# Patient Record
Sex: Female | Born: 1940
Health system: Southern US, Community
[De-identification: ages and names within clinical notes are randomized; demographics above are authoritative.]

## PROBLEM LIST (undated history)

## (undated) DIAGNOSIS — I639 Cerebral infarction, unspecified: Secondary | ICD-10-CM

## (undated) DIAGNOSIS — Z7901 Long term (current) use of anticoagulants: Secondary | ICD-10-CM

## (undated) DIAGNOSIS — Z9889 Other specified postprocedural states: Secondary | ICD-10-CM

## (undated) DIAGNOSIS — I25119 Atherosclerotic heart disease of native coronary artery with unspecified angina pectoris: Secondary | ICD-10-CM

## (undated) DIAGNOSIS — W19XXXS Unspecified fall, sequela: Secondary | ICD-10-CM

## (undated) DIAGNOSIS — I2510 Atherosclerotic heart disease of native coronary artery without angina pectoris: Secondary | ICD-10-CM

## (undated) DIAGNOSIS — I10 Essential (primary) hypertension: Secondary | ICD-10-CM

## (undated) DIAGNOSIS — G20 Parkinson's disease: Secondary | ICD-10-CM

## (undated) DIAGNOSIS — R279 Unspecified lack of coordination: Secondary | ICD-10-CM

## (undated) DIAGNOSIS — R251 Tremor, unspecified: Secondary | ICD-10-CM

## (undated) HISTORY — PX: HERNIA REPAIR: SHX51

## (undated) HISTORY — PX: ABDOMINAL HYSTERECTOMY: SHX81

## (undated) HISTORY — PX: CARDIAC SURGERY: SHX584

## (undated) HISTORY — DX: Unspecified lack of coordination: R27.9

## (undated) HISTORY — DX: Unspecified fall, sequela: W19.XXXS

## (undated) HISTORY — DX: Long term (current) use of anticoagulants: Z79.01

## (undated) HISTORY — PX: CHOLECYSTECTOMY: SHX55

## (undated) HISTORY — DX: Cerebral infarction, unspecified: I63.9

## (undated) HISTORY — PX: ABDOMINAL AORTIC ANEURYSM REPAIR: SHX42

## (undated) HISTORY — PX: CAROTID ENDARTERECTOMY: SUR193

## (undated) HISTORY — DX: Parkinson's disease: G20

## (undated) HISTORY — DX: Other specified postprocedural states: Z98.890

## (undated) HISTORY — DX: Atherosclerotic heart disease of native coronary artery with unspecified angina pectoris: I25.119

## (undated) HISTORY — DX: Tremor, unspecified: R25.1

---

## 2002-02-14 ENCOUNTER — Encounter: Payer: Self-pay | Admitting: Vascular Surgery

## 2002-02-18 ENCOUNTER — Ambulatory Visit (HOSPITAL_COMMUNITY): Admission: RE | Admit: 2002-02-18 | Discharge: 2002-02-18 | Payer: Self-pay | Admitting: Vascular Surgery

## 2002-02-21 ENCOUNTER — Encounter: Payer: Self-pay | Admitting: Vascular Surgery

## 2002-02-21 ENCOUNTER — Inpatient Hospital Stay (HOSPITAL_COMMUNITY): Admission: RE | Admit: 2002-02-21 | Discharge: 2002-02-26 | Payer: Self-pay | Admitting: Vascular Surgery

## 2002-02-22 ENCOUNTER — Encounter: Payer: Self-pay | Admitting: Vascular Surgery

## 2009-11-30 ENCOUNTER — Other Ambulatory Visit: Admission: RE | Admit: 2009-11-30 | Discharge: 2009-11-30 | Payer: Self-pay | Admitting: Diagnostic Radiology

## 2009-11-30 ENCOUNTER — Encounter: Admission: RE | Admit: 2009-11-30 | Discharge: 2009-11-30 | Payer: Self-pay | Admitting: Endocrinology

## 2010-07-22 NOTE — Op Note (Signed)
NAME:  Diana Simmons, Diana Simmons NO.:  [id number]   MEDICAL RECORD NO.:  [id number]                   PATIENT TYPE:  INP   LOCATION:  2314                                 FACILITY:  [facility]   PHYSICIAN:  Quita Skye. Hart Rochester, M.D.               DATE OF BIRTH:  Mar 28, 1940   DATE OF PROCEDURE:  02/21/2002  DATE OF DISCHARGE:                                 OPERATIVE REPORT   PREOPERATIVE DIAGNOSIS:  Infrarenal abdominal aortic aneurysm.   POSTOPERATIVE DIAGNOSIS:  Infrarenal abdominal aortic aneurysm.   OPERATION:  Resection and grafting of infrarenal abdominal aortic aneurysm  with insertion of an aorto-bi-common-iliac graft using a 14 x 7 mm  Hemashield Dacron graft.   SURGEON:  Quita Skye. Hart Rochester, M.D.   FIRST ASSISTANT:  Di Kindle. Edilia Bo, M.D.   SECOND ASSISTANT:  Lissa Merlin, P.A.   ANESTHESIA:  General endotracheal.   PROCEDURE:  The patient was taken to the operating room placed in the supine  position at which time satisfactory general endotracheal anesthesia was  administered.  A Swan-Ganz catheter and radial arterial line were inserted  by anesthesia.  The abdomen and groins were prepped with Betadine scrubbing  solution and draped in routine sterile manner.  Midline incision was made in  the abdomen from xiphoid to just above the pubis carried down through skin  and subcutaneous tissue with the scalpel.  The fascia was divided in the  midline and the peritoneal cavity entered.  Stomach, duodenum, small bowel  and colon were unremarkable.  The liver was smooth with no palpable masses.  Gallbladder appeared normal; no stones were palpable.  Transverse colon was  elevated, the intestines reflected to the right side, retroperitoneum  incised exposing the aorta from the renal arteries to the bifurcation.  There was a small saccular infrarenal aortic aneurysm measuring  approximately 4.5 to 5 cm in diameter.  There was about a 2-3 cm neck  proximally  which was encircled with an umbilical plate.  Both common iliac  arteries had posterior plaque but were widely patent and there was some  aneurysmal dilatation of the right common iliac artery about 3 cm proximal  to its bifurcation.  Twenty-five grams of Mannitol were given intravenously  and the patient was heparinized.  Aorta was occluded distal to the renal  arteries and the common iliacs occluded at their distal bifurcation,  aneurysm was opened anteriorly, there was no laminated thrombus.  Some  lumbars were oversewn with 2-0 silk figure-of-eight sutures from within.  Neck of the aneurysm was transected 2 cm distal to the renal arteries,  endarterectomy was not necessary.  A 14 x 7 mm Hemashield Dacron graft was  anastomosed end to end to the aortic stump using continuous 3-0 Prolene  buttressing this with a strip of felt.  This was checked for leaks; none  were present.  Following this end-to-end anastomoses  were done to the distal  common iliac arteries bilaterally to eliminate the area of aneurysmal  dilatation on the right.  The left leg was opened initially followed by the  right leg with no significant hypotension.  Protamine was then given to  reverse the heparin, following adequate hemostasis the wound was irrigated  with saline and closed in layers with Vicryl in the retroperitoneum as well  as the aneurysm sac.  Following thorough irrigation  the linea alba was closed with #1 Prolene, the skin with clips, sterile  dressing applied, the patient taken to the recovery room in satisfactory  condition.  She received one unit of blood from the Cellsaver, no bank  blood, was stable hemodynamically and had an excellent urinary output.                                               Quita Skye Hart Rochester, M.D.    JDL/MEDQ  D:  02/21/2002  T:  02/22/2002  Job:  [id number]   cc:   Norman Herrlich, M.D.  [facility]  [address]  [place], [place] [address]

## 2010-07-22 NOTE — Op Note (Signed)
NAME:  Diana Simmons, Diana Simmons NO.:  [id number]   MEDICAL RECORD NO.:  [id number]                   PATIENT TYPE:  OIB   LOCATION:  NA                                   FACILITY:  [facility]   PHYSICIAN:  Larina Earthly, M.D.                 DATE OF BIRTH:  01-06-1941   DATE OF PROCEDURE:  02/18/2002  DATE OF DISCHARGE:                                 OPERATIVE REPORT   PREOPERATIVE DIAGNOSIS:  Abdominal aortic aneurysm.   POSTOPERATIVE DIAGNOSIS:  Abdominal aortic aneurysm.   OPERATION PERFORMED:  Aortogram with bilateral lower extremity runoff.   SURGEON:  Larina Earthly, M.D.   ANESTHESIA:  1% lidocaine local.   COMPLICATIONS:  None.   DISPOSITION:  To holding area stable.   DESCRIPTION OF PROCEDURE:  The patient was taken to the peripheral vascular  cath lab and placed in supine position where the area of both groins was  prepped and draped in the usual sterile fashion.  Using local anesthesia and  single wall puncture, the right common femoral artery was entered and the  guidewire was passed up to level of the suprarenal aorta.  The 5 French  sheath was passed over the guidewire and a pigtail catheter was positioned  at the level of the suprarenal aorta.  She had a little blood there.  Runoff  is normal.  AP and lateral projections were undertaken at the level of the  renal arteries.  This revealed widely patent single renal arteries  bilaterally with a long normal infrarenal aortic net prior to the aneurysm  formation.  The iliac arteries were normal in caliber.  There was a small  aneurysm on the right common iliac artery with some ectasia at this level.  The external iliac arteries and left iliac arteries were totally normal.  There was no evidence of aneurysm in the hypogastric arteries.  The pigtail  catheter was then withdrawn down to the level of the aortic bifurcation.  Runoff was obtained.  This revealed widely patent superficial femoral  arteries bilaterally.  Also widely patent profunda femoris arteries  bilaterally.  The patient had widely patent popliteal arteries with small  irregularity in the left popliteal artery.  Runoff was normal bilaterally  with normal three-vessel runoff in both lower extremities.  The patient  tolerated the procedure without immediate complication and was transferred  to the holding area in stable condition.    FINDINGS:  1. Infrarenal abdominal aortic aneurysm extending to the level of the aortic     bifurcation.  2. Mild ectasia of the right common and left popliteal arteries with normal     three-vessel runoff bilaterally.  Larina Earthly, M.D.    TFE/MEDQ  D:  02/18/2002  T:  02/18/2002  Job:  [id number]   cc:   Baldo Daub, M.D.  [facility]  [address]  [place], [place]

## 2010-07-22 NOTE — H&P (Signed)
NAME:  Diana Simmons, Diana Simmons NO.:  [id number]   MEDICAL RECORD NO.:  [id number]                   PATIENT TYPE:  OIB   LOCATION:  NA                                   FACILITY:  [facility]   PHYSICIAN:  Quita Skye. Hart Rochester, M.D.               DATE OF BIRTH:  [birth year]   DATE OF ADMISSION:  DATE OF DISCHARGE:                                HISTORY & PHYSICAL   CHIEF COMPLAINT:  Abdominal aortic aneurysm.   HISTORY OF PRESENT ILLNESS:  This is a 70 year old white female who was  referred by Dr. Harrison Mons for evaluation of AAA.  She has had a known AAA for  some time.  It has recently increased 0.5 cm in size to 4.1 cm.  Dr. Hart Rochester  has been following this.  She has been very anxious and worried about the  chance of this aneurysm rupturing, and it has been affecting her lifestyle.  She has also been on Coumadin for history of cardiovascular disease and  previous TIAs.  She recently saw Dr. Hart Rochester, and he recommended going ahead  with resection and grafting of her AAA.  This has been scheduled for  02/21/2002.  On 02/19/2002, she is to have an arteriogram by Dr. Arbie Cookey.  Today she has no new complaints.  She has occasional abdominal pain,  especially after eating, and occasional chest discomfort but no new symptoms  with regard to her aneurysm.  She continues to work and enjoy fairly good  health.   PAST MEDICAL HISTORY:  1. TIA in 2001 and 2002 with resulting left body weakness with full recovery     attributed to vasospasm.  2. Hypercholesterolemia.  3. Coronary artery disease with MI in 1998.  4. GERD.  5. Hypertension.  6. Anxiety.  7. Uterine cancer.   PAST SURGICAL HISTORY:  1. Right carotid endarterectomy by Dr. Marlyn Corporal in 1996.  2. 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  3. Cystocele surgery.   MEDICATIONS:  1. Coumadin 7.5 mg daily, but this was discontinued on December 10.  She     takes it for cerebrovascular disease.  2. Verapamil SR  120 mg daily.  3. Toprol XL 50 mg daily.  4. Altace 1.25 mg daily.  5. Imdur 120 mg daily.  6. Zocor 40 mg daily.  7. Nexium 40 mg daily.  8. K-Dur 10 mEq daily.  9. Aspirin 81 mg daily.  10.      Folic acid daily.  11.      Effexor 75 mg daily.   ALLERGIES:  PLAVIX, SULFA, PERSANTINE, IV DYE, and NORVASC.   REVIEW OF SYSTEMS:  Positive for occasional dizziness, claudication, GERD,  frequency of urination, dyspnea on exertion, and general weakness.   FAMILY HISTORY:  Mother deceased at age 23 with coronary artery disease.  Father deceased at age 92 from coronary artery disease, CHF, and renal  disease.  She has one sister who has had a CABG and has a pacemaker.  She  has two brothers with heart trouble including arrhythmias.   SOCIAL HISTORY:  She is married.  Her husband has had a CABG by Dr. Riley Kill.  He is in fairly good health and continues to work.  They have two children,  one son, one daughter, both healthy.  She works as an Print production planner at [employer].  She smoked from 1970 to 1990 one pack per day and has  not smoked since.  She denies alcohol use.   PHYSICAL EXAMINATION:  VITAL SIGNS:  Blood pressure 158/110, pulse 72 and  regular, respirations 18.  GENERAL:  Well nourished, well developed, pleasant white female who appears  her stated age, moderately obese, no acute distress, alert and oriented x 3.  HEENT:  Normocephalic and atraumatic.  Pupils are equal, round, and reactive  to light.  Extraocular movements intact.  Visual acuity grossly intact OU.  NECK:  Supple.  No JVD, no bruits.  She has a right carotid endarterectomy  scar.  CHEST: Symmetrical.  Breath sounds are clear and equal anterior and  posterior.  CARDIAC:  Regular rate and rhythm.  S1, S2, with no murmur, rub, or gallop.  ABDOMEN: Soft, obese, nontender with bowel sounds present. There is a small  pulsatile mass palpated in the mid epigastric area.  There are no bruits.  EXTREMITIES:   Warm and well perfused.  Femoral pulses are 2+.  Popliteal and  dorsalis pedis pulses 2+.  NEUROLOGIC:  Grossly nonfocal. Gait is steady.  Muscle strength 5/5.  Deep  tendon reflexes are 3+.  Cranial nerves II-XII grossly intact.   ASSESSMENT/PLAN:  This is 70 year old white female with abdominal aortic  aneurysm who has mild discomfort and has been extremely worried about her  chance of rupture.  Dr. Hart Rochester has recommended going ahead with resection  and grafting of this aneurysm.  She is to have an arteriogram on 02/19/2002  by Dr. Arbie Cookey and resection and grafting of abdominal aortic aneurysm on  02/21/2002.  She has had recent clearance from her cardiologist with a  normal adenosine Cardiolite showing no ischemia and ejection fraction of  67%.  She also has had preoperative Dopplers and lower extremity ankle  brachial indices which were satisfactory.  Other routine tests will be  obtained.  She is stable for surgery.     Lissa Merlin, P.A.                          Quita Skye Hart Rochester, M.D.    Alwyn Ren  D:  02/14/2002  T:  02/14/2002  Job:  [id number]   cc:   Nadine Counts, M.D.   Dr. [facility]

## 2010-07-22 NOTE — Discharge Summary (Signed)
NAME:  Diana Simmons, Diana Simmons NO.:  [id number]   MEDICAL RECORD NO.:  [id number]                   PATIENT TYPE:  INP   LOCATION:  2033                                 FACILITY:  [facility]   PHYSICIAN:  Quita Skye. Hart Rochester, M.D.               DATE OF BIRTH:  1940/10/22   DATE OF ADMISSION:  02/21/2002  DATE OF DISCHARGE:  02/26/2002                                 DISCHARGE SUMMARY   ADMISSION DIAGNOSIS:  Abdominal aortic aneurysm.   PAST MEDICAL HISTORY:  1. TIA (attributed to vasospasm) 2001, 2002.  2. EC CVOD, status post carotid endarterectomy 1996 by Dr. Marlyn Corporal.  3. Coronary artery disease, status post MI 1998.  4. Hypertension.  5. Hyperlipidemia.  6. GERD.  7. Anxiety.  8. Uterine cancer, status post hysterectomy 1975.  9. Cystocele repair.   ALLERGIES:  1. PLAVIX.  2. SULFA.  3. PERSANTINE.  4. IV DYE.  5. NORVASC.   DISCHARGE DIAGNOSES:  A 4.1 cm infrarenal abdominal aortic aneurysm, status  post resection and grafting.   BRIEF HISTORY:  The patient is a 70 year old Caucasian female who Dr. Hart Rochester  had been following her AAA since 2/03, at that time it was 3.6 cm.  Ultrasound at [facility] in 7/03 revealed increased size of this  aneurysm to 4.1 cm.  She was evaluated by Dr. Hart Rochester in 8/03, and returned  again in 11/03 with a repeat duplex scan.  Throughout this entire time she  has remained asymptomatic for back or abdominal pain, but expressed anxiety  regarding the possibility of rupture.  This anxiety is affecting her daily  life and therefore Dr. Hart Rochester recommended proceeding with the surgery.  In  preparation for the surgery she was scheduled for an arteriogram on  02/19/02.   HOSPITAL COURSE:  On 02/21/02 the patient was electively admitted to [facility] under the care of  Dr. Quita Skye. Hart Rochester.  She underwent the following surgical procedure.  Resection and grafting of her abdominal aortic aneurysm with an aortoiliac  bypass with a 14 x 7 mm Hemashield graft.  She tolerated this procedure well  and was transferred in stable condition to the PACU.  She remained  hemodynamically stable during the perioperative period.  Her postoperative  course was essentially uneventful.  Her diet was advanced to liquids on  02/24/02 and solids on 02/25/02.  Her p.o. medications were also resumed on  02/25/02, and this included her Coumadin.   Postoperative ABIs were unchanged from preoperatively,(greater than one  bilaterally).   Recent laboratory studies 1/22 CBC revealed white blood cells 8.8,  hemoglobin 9.9, hematocrit 28.7, platelets were 92.  Chemistries included  sodium 133, potassium 3.8, BUN 8, creatinine 0.7, glucose 111.   DISCHARGE CONDITION:  Improved.   DISCHARGE INSTRUCTIONS:  1. Medications:  Tylox 1-2 p.o. q.4-6h. p.r.n. for pain.  She was instructed  to resume the following home medications:  Verapamil SR 120 mg p.o. q.d.,     Nexium 40 mg p.o. q.d., Zocor 40 mg p.o. q.d., K-Dur 10 mEq p.o. q.d.,     aspirin 81 mg p.o. q.d., folic acid 1 mg p.o. q.d., Effexor 75 mg p.o.     q.d., Toprol XL 50 mg p.o. q.d., Coumadin on her dose prior to admission.  2. Activities:  The patient was asked to refrain from any driving, or any     heavy lifting.  She should also continue her breathing exercises and     daily walking.  3. Diet:  Her diet should be a low fat, low salt diet.  4. Wound care:  She can shower with mild soap and water.  If her incisions     are red, hot, swollen or draining or she has a fever greater than 101     degrees fahrenheit, she should call Dr. Candie Chroman office.  5. Followup:  She has been instructed to follow up with her primary care     Kaeo Jacome regarding monitoring of her Coumadin.  She has an appointment at     the [facility] office on Wednesday, 03/05/02 for skin staple removal.  She will     see a registered nurse at that time.  She also has an appointment at the     [facility] office to  see Dr. Hart Rochester on Tuesday, 03/18/02 at 3:30 p.m.     Toribio Harbour, R.N.                  Quita Skye. Hart Rochester, M.D.    CTK/MEDQ  D:  03/31/2002  T:  03/31/2002  Job:  [id number]   cc:   Nadine Counts  [address]  [place]  [place] [address]  Fax: [phone]

## 2012-09-24 DIAGNOSIS — G20 Parkinson's disease: Secondary | ICD-10-CM

## 2012-09-24 DIAGNOSIS — G20A1 Parkinson's disease without dyskinesia, without mention of fluctuations: Secondary | ICD-10-CM

## 2012-09-24 DIAGNOSIS — R279 Unspecified lack of coordination: Secondary | ICD-10-CM

## 2012-09-24 HISTORY — DX: Unspecified lack of coordination: R27.9

## 2012-09-24 HISTORY — DX: Parkinson's disease without dyskinesia, without mention of fluctuations: G20.A1

## 2012-09-24 HISTORY — DX: Parkinson's disease: G20

## 2014-03-09 DIAGNOSIS — Z23 Encounter for immunization: Secondary | ICD-10-CM | POA: Diagnosis not present

## 2014-04-03 DIAGNOSIS — I639 Cerebral infarction, unspecified: Secondary | ICD-10-CM | POA: Diagnosis not present

## 2014-04-03 DIAGNOSIS — Z7901 Long term (current) use of anticoagulants: Secondary | ICD-10-CM | POA: Diagnosis not present

## 2014-05-04 DIAGNOSIS — Z7901 Long term (current) use of anticoagulants: Secondary | ICD-10-CM | POA: Diagnosis not present

## 2014-05-04 DIAGNOSIS — I639 Cerebral infarction, unspecified: Secondary | ICD-10-CM | POA: Diagnosis not present

## 2014-05-11 DIAGNOSIS — H2513 Age-related nuclear cataract, bilateral: Secondary | ICD-10-CM | POA: Diagnosis not present

## 2014-05-27 DIAGNOSIS — H3530 Unspecified macular degeneration: Secondary | ICD-10-CM | POA: Diagnosis not present

## 2014-05-27 DIAGNOSIS — H2511 Age-related nuclear cataract, right eye: Secondary | ICD-10-CM | POA: Diagnosis not present

## 2014-05-27 DIAGNOSIS — H25811 Combined forms of age-related cataract, right eye: Secondary | ICD-10-CM | POA: Diagnosis not present

## 2014-06-02 DIAGNOSIS — I639 Cerebral infarction, unspecified: Secondary | ICD-10-CM | POA: Diagnosis not present

## 2014-06-02 DIAGNOSIS — Z7901 Long term (current) use of anticoagulants: Secondary | ICD-10-CM | POA: Diagnosis not present

## 2014-06-09 DIAGNOSIS — I639 Cerebral infarction, unspecified: Secondary | ICD-10-CM | POA: Diagnosis not present

## 2014-06-09 DIAGNOSIS — Z7901 Long term (current) use of anticoagulants: Secondary | ICD-10-CM | POA: Diagnosis not present

## 2014-06-11 DIAGNOSIS — H25812 Combined forms of age-related cataract, left eye: Secondary | ICD-10-CM | POA: Diagnosis not present

## 2014-06-11 DIAGNOSIS — H2512 Age-related nuclear cataract, left eye: Secondary | ICD-10-CM | POA: Diagnosis not present

## 2014-06-15 DIAGNOSIS — Z7901 Long term (current) use of anticoagulants: Secondary | ICD-10-CM | POA: Diagnosis not present

## 2014-06-15 DIAGNOSIS — I639 Cerebral infarction, unspecified: Secondary | ICD-10-CM | POA: Diagnosis not present

## 2014-06-29 DIAGNOSIS — Z961 Presence of intraocular lens: Secondary | ICD-10-CM | POA: Diagnosis not present

## 2014-06-29 DIAGNOSIS — I639 Cerebral infarction, unspecified: Secondary | ICD-10-CM | POA: Diagnosis not present

## 2014-06-29 DIAGNOSIS — Z7901 Long term (current) use of anticoagulants: Secondary | ICD-10-CM | POA: Diagnosis not present

## 2014-07-15 DIAGNOSIS — I10 Essential (primary) hypertension: Secondary | ICD-10-CM | POA: Diagnosis not present

## 2014-07-15 DIAGNOSIS — Z1389 Encounter for screening for other disorder: Secondary | ICD-10-CM | POA: Diagnosis not present

## 2014-07-15 DIAGNOSIS — I639 Cerebral infarction, unspecified: Secondary | ICD-10-CM | POA: Diagnosis not present

## 2014-07-15 DIAGNOSIS — K219 Gastro-esophageal reflux disease without esophagitis: Secondary | ICD-10-CM | POA: Diagnosis not present

## 2014-07-15 DIAGNOSIS — E042 Nontoxic multinodular goiter: Secondary | ICD-10-CM | POA: Diagnosis not present

## 2014-07-15 DIAGNOSIS — E559 Vitamin D deficiency, unspecified: Secondary | ICD-10-CM | POA: Diagnosis not present

## 2014-07-15 DIAGNOSIS — M8580 Other specified disorders of bone density and structure, unspecified site: Secondary | ICD-10-CM | POA: Diagnosis not present

## 2014-07-15 DIAGNOSIS — F411 Generalized anxiety disorder: Secondary | ICD-10-CM | POA: Diagnosis not present

## 2014-07-15 DIAGNOSIS — G20 Parkinson's disease: Secondary | ICD-10-CM | POA: Diagnosis not present

## 2014-07-15 DIAGNOSIS — I2510 Atherosclerotic heart disease of native coronary artery without angina pectoris: Secondary | ICD-10-CM | POA: Diagnosis not present

## 2014-07-22 DIAGNOSIS — I679 Cerebrovascular disease, unspecified: Secondary | ICD-10-CM | POA: Diagnosis not present

## 2014-07-22 DIAGNOSIS — I6522 Occlusion and stenosis of left carotid artery: Secondary | ICD-10-CM | POA: Diagnosis not present

## 2014-07-22 DIAGNOSIS — I2510 Atherosclerotic heart disease of native coronary artery without angina pectoris: Secondary | ICD-10-CM | POA: Diagnosis not present

## 2014-07-22 DIAGNOSIS — I10 Essential (primary) hypertension: Secondary | ICD-10-CM | POA: Diagnosis not present

## 2014-07-24 DIAGNOSIS — Z1212 Encounter for screening for malignant neoplasm of rectum: Secondary | ICD-10-CM | POA: Diagnosis not present

## 2014-07-29 DIAGNOSIS — I639 Cerebral infarction, unspecified: Secondary | ICD-10-CM | POA: Diagnosis not present

## 2014-07-31 DIAGNOSIS — I639 Cerebral infarction, unspecified: Secondary | ICD-10-CM | POA: Diagnosis not present

## 2014-08-04 DIAGNOSIS — I639 Cerebral infarction, unspecified: Secondary | ICD-10-CM | POA: Diagnosis not present

## 2014-08-06 DIAGNOSIS — G20 Parkinson's disease: Secondary | ICD-10-CM | POA: Diagnosis not present

## 2014-08-17 DIAGNOSIS — I639 Cerebral infarction, unspecified: Secondary | ICD-10-CM | POA: Diagnosis not present

## 2014-08-31 DIAGNOSIS — I639 Cerebral infarction, unspecified: Secondary | ICD-10-CM | POA: Diagnosis not present

## 2014-09-10 DIAGNOSIS — I639 Cerebral infarction, unspecified: Secondary | ICD-10-CM | POA: Diagnosis not present

## 2014-09-17 DIAGNOSIS — I639 Cerebral infarction, unspecified: Secondary | ICD-10-CM | POA: Diagnosis not present

## 2014-09-23 DIAGNOSIS — M810 Age-related osteoporosis without current pathological fracture: Secondary | ICD-10-CM | POA: Diagnosis not present

## 2014-10-08 DIAGNOSIS — G20 Parkinson's disease: Secondary | ICD-10-CM | POA: Diagnosis not present

## 2014-10-19 DIAGNOSIS — Z7901 Long term (current) use of anticoagulants: Secondary | ICD-10-CM | POA: Diagnosis not present

## 2014-10-19 DIAGNOSIS — I10 Essential (primary) hypertension: Secondary | ICD-10-CM | POA: Diagnosis not present

## 2014-10-19 DIAGNOSIS — G20 Parkinson's disease: Secondary | ICD-10-CM | POA: Diagnosis not present

## 2014-10-19 DIAGNOSIS — I639 Cerebral infarction, unspecified: Secondary | ICD-10-CM | POA: Diagnosis not present

## 2014-11-17 DIAGNOSIS — I639 Cerebral infarction, unspecified: Secondary | ICD-10-CM | POA: Diagnosis not present

## 2014-11-17 DIAGNOSIS — M810 Age-related osteoporosis without current pathological fracture: Secondary | ICD-10-CM | POA: Diagnosis not present

## 2014-11-22 DIAGNOSIS — I25119 Atherosclerotic heart disease of native coronary artery with unspecified angina pectoris: Secondary | ICD-10-CM | POA: Insufficient documentation

## 2014-11-22 DIAGNOSIS — Z7901 Long term (current) use of anticoagulants: Secondary | ICD-10-CM

## 2014-11-22 HISTORY — DX: Atherosclerotic heart disease of native coronary artery with unspecified angina pectoris: I25.119

## 2014-11-22 HISTORY — DX: Long term (current) use of anticoagulants: Z79.01

## 2014-11-23 DIAGNOSIS — I25119 Atherosclerotic heart disease of native coronary artery with unspecified angina pectoris: Secondary | ICD-10-CM | POA: Diagnosis not present

## 2014-11-23 DIAGNOSIS — I2510 Atherosclerotic heart disease of native coronary artery without angina pectoris: Secondary | ICD-10-CM | POA: Diagnosis not present

## 2014-11-25 DIAGNOSIS — M8580 Other specified disorders of bone density and structure, unspecified site: Secondary | ICD-10-CM | POA: Diagnosis not present

## 2014-12-07 DIAGNOSIS — Z23 Encounter for immunization: Secondary | ICD-10-CM | POA: Diagnosis not present

## 2014-12-17 DIAGNOSIS — I639 Cerebral infarction, unspecified: Secondary | ICD-10-CM | POA: Diagnosis not present

## 2015-01-07 DIAGNOSIS — G20 Parkinson's disease: Secondary | ICD-10-CM | POA: Diagnosis not present

## 2015-01-18 DIAGNOSIS — I639 Cerebral infarction, unspecified: Secondary | ICD-10-CM | POA: Diagnosis not present

## 2015-02-18 DIAGNOSIS — I639 Cerebral infarction, unspecified: Secondary | ICD-10-CM | POA: Diagnosis not present

## 2015-03-22 DIAGNOSIS — I639 Cerebral infarction, unspecified: Secondary | ICD-10-CM | POA: Diagnosis not present

## 2015-03-28 DIAGNOSIS — I10 Essential (primary) hypertension: Secondary | ICD-10-CM | POA: Diagnosis not present

## 2015-03-28 DIAGNOSIS — R51 Headache: Secondary | ICD-10-CM | POA: Diagnosis not present

## 2015-03-28 DIAGNOSIS — E876 Hypokalemia: Secondary | ICD-10-CM | POA: Diagnosis not present

## 2015-03-29 DIAGNOSIS — I10 Essential (primary) hypertension: Secondary | ICD-10-CM | POA: Diagnosis not present

## 2015-03-31 DIAGNOSIS — I714 Abdominal aortic aneurysm, without rupture: Secondary | ICD-10-CM | POA: Diagnosis not present

## 2015-03-31 DIAGNOSIS — I10 Essential (primary) hypertension: Secondary | ICD-10-CM | POA: Diagnosis not present

## 2015-04-09 DIAGNOSIS — E876 Hypokalemia: Secondary | ICD-10-CM | POA: Diagnosis not present

## 2015-04-09 DIAGNOSIS — I639 Cerebral infarction, unspecified: Secondary | ICD-10-CM | POA: Diagnosis not present

## 2015-04-09 DIAGNOSIS — Z7901 Long term (current) use of anticoagulants: Secondary | ICD-10-CM | POA: Diagnosis not present

## 2015-04-09 DIAGNOSIS — I10 Essential (primary) hypertension: Secondary | ICD-10-CM | POA: Diagnosis not present

## 2015-04-16 DIAGNOSIS — Z1231 Encounter for screening mammogram for malignant neoplasm of breast: Secondary | ICD-10-CM | POA: Diagnosis not present

## 2015-04-22 DIAGNOSIS — I639 Cerebral infarction, unspecified: Secondary | ICD-10-CM | POA: Diagnosis not present

## 2015-04-29 DIAGNOSIS — I639 Cerebral infarction, unspecified: Secondary | ICD-10-CM | POA: Diagnosis not present

## 2015-05-06 DIAGNOSIS — I639 Cerebral infarction, unspecified: Secondary | ICD-10-CM | POA: Diagnosis not present

## 2015-05-19 DIAGNOSIS — I639 Cerebral infarction, unspecified: Secondary | ICD-10-CM | POA: Diagnosis not present

## 2015-05-31 DIAGNOSIS — Z7901 Long term (current) use of anticoagulants: Secondary | ICD-10-CM | POA: Diagnosis not present

## 2015-06-21 DIAGNOSIS — Z7901 Long term (current) use of anticoagulants: Secondary | ICD-10-CM | POA: Diagnosis not present

## 2015-07-19 DIAGNOSIS — R1011 Right upper quadrant pain: Secondary | ICD-10-CM | POA: Diagnosis not present

## 2015-07-19 DIAGNOSIS — Z7901 Long term (current) use of anticoagulants: Secondary | ICD-10-CM | POA: Diagnosis not present

## 2015-07-19 DIAGNOSIS — R1031 Right lower quadrant pain: Secondary | ICD-10-CM | POA: Diagnosis not present

## 2015-07-20 DIAGNOSIS — R1011 Right upper quadrant pain: Secondary | ICD-10-CM | POA: Diagnosis not present

## 2015-07-20 DIAGNOSIS — R197 Diarrhea, unspecified: Secondary | ICD-10-CM | POA: Diagnosis not present

## 2015-07-20 DIAGNOSIS — I10 Essential (primary) hypertension: Secondary | ICD-10-CM | POA: Diagnosis not present

## 2015-07-20 DIAGNOSIS — I25119 Atherosclerotic heart disease of native coronary artery with unspecified angina pectoris: Secondary | ICD-10-CM | POA: Diagnosis not present

## 2015-07-20 DIAGNOSIS — R1031 Right lower quadrant pain: Secondary | ICD-10-CM | POA: Diagnosis not present

## 2015-07-30 DIAGNOSIS — I10 Essential (primary) hypertension: Secondary | ICD-10-CM | POA: Diagnosis not present

## 2015-07-30 DIAGNOSIS — R350 Frequency of micturition: Secondary | ICD-10-CM | POA: Diagnosis not present

## 2015-08-16 DIAGNOSIS — Z7901 Long term (current) use of anticoagulants: Secondary | ICD-10-CM | POA: Diagnosis not present

## 2015-08-17 DIAGNOSIS — Z9889 Other specified postprocedural states: Secondary | ICD-10-CM | POA: Diagnosis not present

## 2015-08-17 DIAGNOSIS — G20 Parkinson's disease: Secondary | ICD-10-CM | POA: Diagnosis not present

## 2015-08-17 DIAGNOSIS — I639 Cerebral infarction, unspecified: Secondary | ICD-10-CM | POA: Diagnosis not present

## 2015-08-17 DIAGNOSIS — W19XXXS Unspecified fall, sequela: Secondary | ICD-10-CM | POA: Diagnosis not present

## 2015-08-17 DIAGNOSIS — R251 Tremor, unspecified: Secondary | ICD-10-CM | POA: Diagnosis not present

## 2015-08-31 DIAGNOSIS — K591 Functional diarrhea: Secondary | ICD-10-CM | POA: Diagnosis not present

## 2015-08-31 DIAGNOSIS — R1011 Right upper quadrant pain: Secondary | ICD-10-CM | POA: Diagnosis not present

## 2015-09-09 DIAGNOSIS — R1011 Right upper quadrant pain: Secondary | ICD-10-CM | POA: Diagnosis not present

## 2015-09-10 DIAGNOSIS — K5730 Diverticulosis of large intestine without perforation or abscess without bleeding: Secondary | ICD-10-CM | POA: Diagnosis not present

## 2015-09-10 DIAGNOSIS — K640 First degree hemorrhoids: Secondary | ICD-10-CM | POA: Diagnosis not present

## 2015-09-10 DIAGNOSIS — D175 Benign lipomatous neoplasm of intra-abdominal organs: Secondary | ICD-10-CM | POA: Diagnosis not present

## 2015-09-10 DIAGNOSIS — K591 Functional diarrhea: Secondary | ICD-10-CM | POA: Diagnosis not present

## 2015-09-10 DIAGNOSIS — K2900 Acute gastritis without bleeding: Secondary | ICD-10-CM | POA: Diagnosis not present

## 2015-09-10 DIAGNOSIS — R1011 Right upper quadrant pain: Secondary | ICD-10-CM | POA: Diagnosis not present

## 2015-09-10 DIAGNOSIS — K649 Unspecified hemorrhoids: Secondary | ICD-10-CM | POA: Diagnosis not present

## 2015-09-13 DIAGNOSIS — Z7901 Long term (current) use of anticoagulants: Secondary | ICD-10-CM | POA: Diagnosis not present

## 2015-09-17 DIAGNOSIS — I10 Essential (primary) hypertension: Secondary | ICD-10-CM | POA: Diagnosis not present

## 2015-09-17 DIAGNOSIS — E559 Vitamin D deficiency, unspecified: Secondary | ICD-10-CM | POA: Diagnosis not present

## 2015-09-17 DIAGNOSIS — Z0000 Encounter for general adult medical examination without abnormal findings: Secondary | ICD-10-CM | POA: Diagnosis not present

## 2015-09-17 DIAGNOSIS — Z1389 Encounter for screening for other disorder: Secondary | ICD-10-CM | POA: Diagnosis not present

## 2015-09-17 DIAGNOSIS — E042 Nontoxic multinodular goiter: Secondary | ICD-10-CM | POA: Diagnosis not present

## 2015-09-17 DIAGNOSIS — M810 Age-related osteoporosis without current pathological fracture: Secondary | ICD-10-CM | POA: Diagnosis not present

## 2015-09-17 DIAGNOSIS — I679 Cerebrovascular disease, unspecified: Secondary | ICD-10-CM | POA: Diagnosis not present

## 2015-09-17 DIAGNOSIS — I2510 Atherosclerotic heart disease of native coronary artery without angina pectoris: Secondary | ICD-10-CM | POA: Diagnosis not present

## 2015-09-17 DIAGNOSIS — E7800 Pure hypercholesterolemia, unspecified: Secondary | ICD-10-CM | POA: Diagnosis not present

## 2015-09-28 DIAGNOSIS — M6281 Muscle weakness (generalized): Secondary | ICD-10-CM | POA: Diagnosis not present

## 2015-09-28 DIAGNOSIS — R269 Unspecified abnormalities of gait and mobility: Secondary | ICD-10-CM | POA: Diagnosis not present

## 2015-09-28 DIAGNOSIS — R278 Other lack of coordination: Secondary | ICD-10-CM | POA: Diagnosis not present

## 2015-09-28 DIAGNOSIS — R201 Hypoesthesia of skin: Secondary | ICD-10-CM | POA: Diagnosis not present

## 2015-09-28 DIAGNOSIS — R2681 Unsteadiness on feet: Secondary | ICD-10-CM | POA: Diagnosis not present

## 2015-09-29 DIAGNOSIS — I6523 Occlusion and stenosis of bilateral carotid arteries: Secondary | ICD-10-CM | POA: Diagnosis not present

## 2015-09-29 DIAGNOSIS — Z780 Asymptomatic menopausal state: Secondary | ICD-10-CM | POA: Diagnosis not present

## 2015-09-29 DIAGNOSIS — M8588 Other specified disorders of bone density and structure, other site: Secondary | ICD-10-CM | POA: Diagnosis not present

## 2015-09-30 DIAGNOSIS — M6281 Muscle weakness (generalized): Secondary | ICD-10-CM | POA: Diagnosis not present

## 2015-09-30 DIAGNOSIS — R269 Unspecified abnormalities of gait and mobility: Secondary | ICD-10-CM | POA: Diagnosis not present

## 2015-09-30 DIAGNOSIS — R278 Other lack of coordination: Secondary | ICD-10-CM | POA: Diagnosis not present

## 2015-09-30 DIAGNOSIS — R201 Hypoesthesia of skin: Secondary | ICD-10-CM | POA: Diagnosis not present

## 2015-09-30 DIAGNOSIS — R2681 Unsteadiness on feet: Secondary | ICD-10-CM | POA: Diagnosis not present

## 2015-10-05 DIAGNOSIS — M6281 Muscle weakness (generalized): Secondary | ICD-10-CM | POA: Diagnosis not present

## 2015-10-05 DIAGNOSIS — R278 Other lack of coordination: Secondary | ICD-10-CM | POA: Diagnosis not present

## 2015-10-05 DIAGNOSIS — R2681 Unsteadiness on feet: Secondary | ICD-10-CM | POA: Diagnosis not present

## 2015-10-05 DIAGNOSIS — R201 Hypoesthesia of skin: Secondary | ICD-10-CM | POA: Diagnosis not present

## 2015-10-05 DIAGNOSIS — R269 Unspecified abnormalities of gait and mobility: Secondary | ICD-10-CM | POA: Diagnosis not present

## 2015-10-07 DIAGNOSIS — R2681 Unsteadiness on feet: Secondary | ICD-10-CM | POA: Diagnosis not present

## 2015-10-07 DIAGNOSIS — R278 Other lack of coordination: Secondary | ICD-10-CM | POA: Diagnosis not present

## 2015-10-07 DIAGNOSIS — M6281 Muscle weakness (generalized): Secondary | ICD-10-CM | POA: Diagnosis not present

## 2015-10-07 DIAGNOSIS — R269 Unspecified abnormalities of gait and mobility: Secondary | ICD-10-CM | POA: Diagnosis not present

## 2015-10-07 DIAGNOSIS — R201 Hypoesthesia of skin: Secondary | ICD-10-CM | POA: Diagnosis not present

## 2015-10-11 DIAGNOSIS — Z7901 Long term (current) use of anticoagulants: Secondary | ICD-10-CM | POA: Diagnosis not present

## 2015-10-12 DIAGNOSIS — M6281 Muscle weakness (generalized): Secondary | ICD-10-CM | POA: Diagnosis not present

## 2015-10-12 DIAGNOSIS — R2681 Unsteadiness on feet: Secondary | ICD-10-CM | POA: Diagnosis not present

## 2015-10-12 DIAGNOSIS — R278 Other lack of coordination: Secondary | ICD-10-CM | POA: Diagnosis not present

## 2015-10-12 DIAGNOSIS — R269 Unspecified abnormalities of gait and mobility: Secondary | ICD-10-CM | POA: Diagnosis not present

## 2015-10-12 DIAGNOSIS — R201 Hypoesthesia of skin: Secondary | ICD-10-CM | POA: Diagnosis not present

## 2015-10-19 DIAGNOSIS — R278 Other lack of coordination: Secondary | ICD-10-CM | POA: Diagnosis not present

## 2015-10-19 DIAGNOSIS — R269 Unspecified abnormalities of gait and mobility: Secondary | ICD-10-CM | POA: Diagnosis not present

## 2015-10-19 DIAGNOSIS — R201 Hypoesthesia of skin: Secondary | ICD-10-CM | POA: Diagnosis not present

## 2015-10-19 DIAGNOSIS — R2681 Unsteadiness on feet: Secondary | ICD-10-CM | POA: Diagnosis not present

## 2015-10-19 DIAGNOSIS — M6281 Muscle weakness (generalized): Secondary | ICD-10-CM | POA: Diagnosis not present

## 2015-10-21 DIAGNOSIS — R269 Unspecified abnormalities of gait and mobility: Secondary | ICD-10-CM | POA: Diagnosis not present

## 2015-10-21 DIAGNOSIS — R278 Other lack of coordination: Secondary | ICD-10-CM | POA: Diagnosis not present

## 2015-10-21 DIAGNOSIS — M6281 Muscle weakness (generalized): Secondary | ICD-10-CM | POA: Diagnosis not present

## 2015-10-21 DIAGNOSIS — R2681 Unsteadiness on feet: Secondary | ICD-10-CM | POA: Diagnosis not present

## 2015-10-21 DIAGNOSIS — R201 Hypoesthesia of skin: Secondary | ICD-10-CM | POA: Diagnosis not present

## 2015-10-25 DIAGNOSIS — R278 Other lack of coordination: Secondary | ICD-10-CM | POA: Diagnosis not present

## 2015-10-25 DIAGNOSIS — R269 Unspecified abnormalities of gait and mobility: Secondary | ICD-10-CM | POA: Diagnosis not present

## 2015-10-25 DIAGNOSIS — R201 Hypoesthesia of skin: Secondary | ICD-10-CM | POA: Diagnosis not present

## 2015-10-25 DIAGNOSIS — M6281 Muscle weakness (generalized): Secondary | ICD-10-CM | POA: Diagnosis not present

## 2015-10-25 DIAGNOSIS — R2681 Unsteadiness on feet: Secondary | ICD-10-CM | POA: Diagnosis not present

## 2015-10-27 DIAGNOSIS — R278 Other lack of coordination: Secondary | ICD-10-CM | POA: Diagnosis not present

## 2015-10-27 DIAGNOSIS — Z23 Encounter for immunization: Secondary | ICD-10-CM | POA: Diagnosis not present

## 2015-10-27 DIAGNOSIS — R2681 Unsteadiness on feet: Secondary | ICD-10-CM | POA: Diagnosis not present

## 2015-10-27 DIAGNOSIS — R269 Unspecified abnormalities of gait and mobility: Secondary | ICD-10-CM | POA: Diagnosis not present

## 2015-10-27 DIAGNOSIS — M6281 Muscle weakness (generalized): Secondary | ICD-10-CM | POA: Diagnosis not present

## 2015-10-27 DIAGNOSIS — R201 Hypoesthesia of skin: Secondary | ICD-10-CM | POA: Diagnosis not present

## 2015-10-29 DIAGNOSIS — R2681 Unsteadiness on feet: Secondary | ICD-10-CM | POA: Diagnosis not present

## 2015-10-29 DIAGNOSIS — R278 Other lack of coordination: Secondary | ICD-10-CM | POA: Diagnosis not present

## 2015-10-29 DIAGNOSIS — R269 Unspecified abnormalities of gait and mobility: Secondary | ICD-10-CM | POA: Diagnosis not present

## 2015-10-29 DIAGNOSIS — M6281 Muscle weakness (generalized): Secondary | ICD-10-CM | POA: Diagnosis not present

## 2015-10-29 DIAGNOSIS — R201 Hypoesthesia of skin: Secondary | ICD-10-CM | POA: Diagnosis not present

## 2015-11-01 DIAGNOSIS — R278 Other lack of coordination: Secondary | ICD-10-CM | POA: Diagnosis not present

## 2015-11-01 DIAGNOSIS — R2681 Unsteadiness on feet: Secondary | ICD-10-CM | POA: Diagnosis not present

## 2015-11-01 DIAGNOSIS — R201 Hypoesthesia of skin: Secondary | ICD-10-CM | POA: Diagnosis not present

## 2015-11-01 DIAGNOSIS — M6281 Muscle weakness (generalized): Secondary | ICD-10-CM | POA: Diagnosis not present

## 2015-11-01 DIAGNOSIS — R269 Unspecified abnormalities of gait and mobility: Secondary | ICD-10-CM | POA: Diagnosis not present

## 2015-11-03 DIAGNOSIS — R269 Unspecified abnormalities of gait and mobility: Secondary | ICD-10-CM | POA: Diagnosis not present

## 2015-11-03 DIAGNOSIS — R2681 Unsteadiness on feet: Secondary | ICD-10-CM | POA: Diagnosis not present

## 2015-11-03 DIAGNOSIS — R278 Other lack of coordination: Secondary | ICD-10-CM | POA: Diagnosis not present

## 2015-11-03 DIAGNOSIS — M6281 Muscle weakness (generalized): Secondary | ICD-10-CM | POA: Diagnosis not present

## 2015-11-03 DIAGNOSIS — R201 Hypoesthesia of skin: Secondary | ICD-10-CM | POA: Diagnosis not present

## 2015-11-08 DIAGNOSIS — Z7901 Long term (current) use of anticoagulants: Secondary | ICD-10-CM | POA: Diagnosis not present

## 2015-11-09 DIAGNOSIS — R2681 Unsteadiness on feet: Secondary | ICD-10-CM | POA: Diagnosis not present

## 2015-11-09 DIAGNOSIS — M6281 Muscle weakness (generalized): Secondary | ICD-10-CM | POA: Diagnosis not present

## 2015-11-09 DIAGNOSIS — R201 Hypoesthesia of skin: Secondary | ICD-10-CM | POA: Diagnosis not present

## 2015-11-09 DIAGNOSIS — R278 Other lack of coordination: Secondary | ICD-10-CM | POA: Diagnosis not present

## 2015-11-11 DIAGNOSIS — R2681 Unsteadiness on feet: Secondary | ICD-10-CM | POA: Diagnosis not present

## 2015-11-11 DIAGNOSIS — R201 Hypoesthesia of skin: Secondary | ICD-10-CM | POA: Diagnosis not present

## 2015-11-11 DIAGNOSIS — M6281 Muscle weakness (generalized): Secondary | ICD-10-CM | POA: Diagnosis not present

## 2015-11-11 DIAGNOSIS — R278 Other lack of coordination: Secondary | ICD-10-CM | POA: Diagnosis not present

## 2015-11-23 DIAGNOSIS — M6281 Muscle weakness (generalized): Secondary | ICD-10-CM | POA: Diagnosis not present

## 2015-11-23 DIAGNOSIS — R201 Hypoesthesia of skin: Secondary | ICD-10-CM | POA: Diagnosis not present

## 2015-11-23 DIAGNOSIS — R2681 Unsteadiness on feet: Secondary | ICD-10-CM | POA: Diagnosis not present

## 2015-11-23 DIAGNOSIS — R278 Other lack of coordination: Secondary | ICD-10-CM | POA: Diagnosis not present

## 2015-11-25 DIAGNOSIS — R201 Hypoesthesia of skin: Secondary | ICD-10-CM | POA: Diagnosis not present

## 2015-11-25 DIAGNOSIS — M6281 Muscle weakness (generalized): Secondary | ICD-10-CM | POA: Diagnosis not present

## 2015-11-25 DIAGNOSIS — R278 Other lack of coordination: Secondary | ICD-10-CM | POA: Diagnosis not present

## 2015-11-25 DIAGNOSIS — R2681 Unsteadiness on feet: Secondary | ICD-10-CM | POA: Diagnosis not present

## 2015-12-07 DIAGNOSIS — Z7901 Long term (current) use of anticoagulants: Secondary | ICD-10-CM | POA: Diagnosis not present

## 2015-12-21 DIAGNOSIS — Z5181 Encounter for therapeutic drug level monitoring: Secondary | ICD-10-CM | POA: Diagnosis not present

## 2015-12-21 DIAGNOSIS — I679 Cerebrovascular disease, unspecified: Secondary | ICD-10-CM | POA: Diagnosis not present

## 2015-12-21 DIAGNOSIS — M549 Dorsalgia, unspecified: Secondary | ICD-10-CM | POA: Diagnosis not present

## 2015-12-21 DIAGNOSIS — I10 Essential (primary) hypertension: Secondary | ICD-10-CM | POA: Diagnosis not present

## 2015-12-21 DIAGNOSIS — Z79899 Other long term (current) drug therapy: Secondary | ICD-10-CM | POA: Diagnosis not present

## 2016-01-03 DIAGNOSIS — Z7901 Long term (current) use of anticoagulants: Secondary | ICD-10-CM | POA: Diagnosis not present

## 2016-01-31 DIAGNOSIS — Z7901 Long term (current) use of anticoagulants: Secondary | ICD-10-CM | POA: Diagnosis not present

## 2016-02-07 DIAGNOSIS — I10 Essential (primary) hypertension: Secondary | ICD-10-CM | POA: Diagnosis not present

## 2016-02-07 DIAGNOSIS — M533 Sacrococcygeal disorders, not elsewhere classified: Secondary | ICD-10-CM | POA: Diagnosis not present

## 2016-02-15 DIAGNOSIS — R251 Tremor, unspecified: Secondary | ICD-10-CM

## 2016-02-15 DIAGNOSIS — Z9889 Other specified postprocedural states: Secondary | ICD-10-CM

## 2016-02-15 DIAGNOSIS — I639 Cerebral infarction, unspecified: Secondary | ICD-10-CM

## 2016-02-15 DIAGNOSIS — W19XXXS Unspecified fall, sequela: Secondary | ICD-10-CM

## 2016-02-15 HISTORY — DX: Tremor, unspecified: R25.1

## 2016-02-15 HISTORY — DX: Unspecified fall, sequela: W19.XXXS

## 2016-02-15 HISTORY — DX: Cerebral infarction, unspecified: I63.9

## 2016-02-15 HISTORY — DX: Other specified postprocedural states: Z98.890

## 2016-03-01 DIAGNOSIS — Z7901 Long term (current) use of anticoagulants: Secondary | ICD-10-CM | POA: Diagnosis not present

## 2016-03-28 DIAGNOSIS — Z7901 Long term (current) use of anticoagulants: Secondary | ICD-10-CM | POA: Diagnosis not present

## 2016-04-18 DIAGNOSIS — M533 Sacrococcygeal disorders, not elsewhere classified: Secondary | ICD-10-CM | POA: Diagnosis not present

## 2016-04-21 DIAGNOSIS — M5416 Radiculopathy, lumbar region: Secondary | ICD-10-CM | POA: Diagnosis not present

## 2016-04-24 DIAGNOSIS — Z7901 Long term (current) use of anticoagulants: Secondary | ICD-10-CM | POA: Diagnosis not present

## 2016-04-26 DIAGNOSIS — M5416 Radiculopathy, lumbar region: Secondary | ICD-10-CM | POA: Diagnosis not present

## 2016-04-26 DIAGNOSIS — M545 Low back pain: Secondary | ICD-10-CM | POA: Diagnosis not present

## 2016-05-04 DIAGNOSIS — M5416 Radiculopathy, lumbar region: Secondary | ICD-10-CM | POA: Diagnosis not present

## 2016-05-04 DIAGNOSIS — M48061 Spinal stenosis, lumbar region without neurogenic claudication: Secondary | ICD-10-CM | POA: Diagnosis not present

## 2016-05-04 DIAGNOSIS — Z7901 Long term (current) use of anticoagulants: Secondary | ICD-10-CM | POA: Diagnosis not present

## 2016-05-22 DIAGNOSIS — Z7901 Long term (current) use of anticoagulants: Secondary | ICD-10-CM | POA: Diagnosis not present

## 2016-06-19 DIAGNOSIS — Z7901 Long term (current) use of anticoagulants: Secondary | ICD-10-CM | POA: Diagnosis not present

## 2016-07-03 DIAGNOSIS — H16201 Unspecified keratoconjunctivitis, right eye: Secondary | ICD-10-CM | POA: Diagnosis not present

## 2016-07-05 DIAGNOSIS — H16201 Unspecified keratoconjunctivitis, right eye: Secondary | ICD-10-CM | POA: Diagnosis not present

## 2016-07-12 ENCOUNTER — Encounter (HOSPITAL_COMMUNITY): Payer: Self-pay | Admitting: Emergency Medicine

## 2016-07-12 ENCOUNTER — Emergency Department (HOSPITAL_COMMUNITY): Payer: Medicare Other

## 2016-07-12 ENCOUNTER — Emergency Department (HOSPITAL_COMMUNITY)
Admission: EM | Admit: 2016-07-12 | Discharge: 2016-07-13 | Disposition: A | Discharge status: Home / Self Care | Payer: Medicare Other | Attending: Emergency Medicine | Admitting: Emergency Medicine

## 2016-07-12 DIAGNOSIS — S0990XA Unspecified injury of head, initial encounter: Secondary | ICD-10-CM | POA: Insufficient documentation

## 2016-07-12 DIAGNOSIS — Y929 Unspecified place or not applicable: Secondary | ICD-10-CM | POA: Insufficient documentation

## 2016-07-12 DIAGNOSIS — S2242XA Multiple fractures of ribs, left side, initial encounter for closed fracture: Secondary | ICD-10-CM | POA: Insufficient documentation

## 2016-07-12 DIAGNOSIS — S299XXA Unspecified injury of thorax, initial encounter: Secondary | ICD-10-CM | POA: Diagnosis not present

## 2016-07-12 DIAGNOSIS — I10 Essential (primary) hypertension: Secondary | ICD-10-CM | POA: Diagnosis not present

## 2016-07-12 DIAGNOSIS — Y939 Activity, unspecified: Secondary | ICD-10-CM | POA: Diagnosis not present

## 2016-07-12 DIAGNOSIS — S0083XA Contusion of other part of head, initial encounter: Secondary | ICD-10-CM | POA: Diagnosis not present

## 2016-07-12 DIAGNOSIS — R0781 Pleurodynia: Secondary | ICD-10-CM | POA: Diagnosis not present

## 2016-07-12 DIAGNOSIS — Y999 Unspecified external cause status: Secondary | ICD-10-CM | POA: Diagnosis not present

## 2016-07-12 DIAGNOSIS — I2510 Atherosclerotic heart disease of native coronary artery without angina pectoris: Secondary | ICD-10-CM | POA: Diagnosis not present

## 2016-07-12 DIAGNOSIS — W01198A Fall on same level from slipping, tripping and stumbling with subsequent striking against other object, initial encounter: Secondary | ICD-10-CM | POA: Diagnosis not present

## 2016-07-12 DIAGNOSIS — S41109A Unspecified open wound of unspecified upper arm, initial encounter: Secondary | ICD-10-CM | POA: Diagnosis not present

## 2016-07-12 DIAGNOSIS — Z8673 Personal history of transient ischemic attack (TIA), and cerebral infarction without residual deficits: Secondary | ICD-10-CM | POA: Diagnosis not present

## 2016-07-12 DIAGNOSIS — Z23 Encounter for immunization: Secondary | ICD-10-CM | POA: Insufficient documentation

## 2016-07-12 DIAGNOSIS — T148XXA Other injury of unspecified body region, initial encounter: Secondary | ICD-10-CM | POA: Diagnosis not present

## 2016-07-12 HISTORY — DX: Cerebral infarction, unspecified: I63.9

## 2016-07-12 HISTORY — DX: Essential (primary) hypertension: I10

## 2016-07-12 HISTORY — DX: Atherosclerotic heart disease of native coronary artery without angina pectoris: I25.10

## 2016-07-12 MED ORDER — HYDROCODONE-ACETAMINOPHEN 5-325 MG PO TABS
1.0000 | ORAL_TABLET | Freq: Once | ORAL | Status: AC
  Administered 2016-07-12: 1 via ORAL
  Filled 2016-07-12: qty 1

## 2016-07-12 MED ORDER — TETANUS-DIPHTH-ACELL PERTUSSIS 5-2.5-18.5 LF-MCG/0.5 IM SUSP
0.5000 mL | Freq: Once | INTRAMUSCULAR | Status: AC
  Administered 2016-07-12: 0.5 mL via INTRAMUSCULAR
  Filled 2016-07-12: qty 0.5

## 2016-07-12 NOTE — ED Notes (Signed)
Patient transported to X-ray 

## 2016-07-12 NOTE — ED Notes (Signed)
Pt taken to CT.

## 2016-07-12 NOTE — ED Notes (Addendum)
Pt belongings: shirt, undergarment, and readers glasses in personal belonging bag @ bedside.

## 2016-07-12 NOTE — ED Triage Notes (Signed)
Per EMS, pt fell on the bleachers at a game, No LOC or memory loss, reports left flank pain and left femor.  Clear lung sounds, hurts w/ deep breaths. Pt is on blood thinners Hx of stoke w/ left sided weakness, MI and anxiety.

## 2016-07-12 NOTE — ED Provider Notes (Signed)
Medical screening examination/treatment/procedure(s) were conducted as a shared visit with non-physician practitioner(s) and myself.  I personally evaluated the patient during the encounter.  Fall with multiple rib fractures on coumadin and hit head w/o LOC.  On my exam, neuro intact. Has significant left chest tenderness but normal breath sounds, oxygen of 98% and no tachypnea. No obviou secchymosis currently. Low suspicion for hemo/pneumo-thorax but at risk for same so long discussion had regarding strict return precautison. otherwise stable for dc on pain control if head CT unremarkable.   Merrily Pew, MD 07/13/16 1230

## 2016-07-12 NOTE — ED Notes (Signed)
Pt returned from CT °

## 2016-07-12 NOTE — ED Provider Notes (Signed)
[facility] DEPT Provider Note   CSN: [id number] Arrival date & time: 07/12/16  2057     History   Chief Complaint Chief Complaint  Patient presents with  . Fall    HPI Diana Simmons is a 76 y.o. female.  This a 76 year old female who was at a soccer game when she stood up after the game was completed.  She became slightly dizzy and fell to the ground without losing consciousness.  She hit the left ribs, on the side of a bleacher.  She hit her left elbow on the concrete, causing a small abrasion and hit her head in the left temporal area on an unknown object.  She has a slight headache.  She states she's got some blurry vision and she feels sleepy.      Past Medical History:  Diagnosis Date  . Coronary artery disease   . Hypertension   . Stroke [facility])     There are no active problems to display for this patient.   Past Surgical History:  Procedure Laterality Date  . CARDIAC SURGERY      OB History    No data available       Home Medications    Prior to Admission medications   Medication Sig Start Date End Date Taking? Authorizing Provider  HYDROcodone-acetaminophen (NORCO/VICODIN) 5-325 MG tablet Take 1-2 tablets by mouth every 4 (four) hours as needed for moderate pain. 07/13/16   Junius Creamer, NP    Family History No family history on file.  Social History Social History  Substance Use Topics  . Smoking status: Not on file  . Smokeless tobacco: Not on file  . Alcohol use Not on file     Allergies   Patient has no allergy information on record.   Review of Systems Review of Systems  Constitutional: Negative for fever.  Eyes: Positive for visual disturbance.  Cardiovascular: Positive for chest pain.  Skin: Positive for wound.  Neurological: Positive for dizziness. Negative for headaches.  All other systems reviewed and are negative.    Physical Exam Updated Vital Signs BP (!) 164/84   Pulse 68   Temp 99.2 F (37.3 C) (Oral)   Resp  18   SpO2 100%   Physical Exam  Constitutional: She appears well-developed and well-nourished.  HENT:  Head: Normocephalic.  Eyes: Pupils are equal, round, and reactive to light.  Neck: Normal range of motion.  Cardiovascular: Normal rate.   Pulmonary/Chest: Effort normal. She exhibits tenderness. She exhibits no crepitus and no deformity.    Area is tender to palpation, no deformity.  Equal chest rise and fall.  Breath sounds clear  Abdominal: Soft.  Musculoskeletal: She exhibits tenderness. She exhibits no edema or deformity.       Arms: Neurological: She is alert.  Skin: Skin is warm and dry.  Psychiatric: She has a normal mood and affect.  Nursing note and vitals reviewed.    ED Treatments / Results  Labs (all labs ordered are listed, but only abnormal results are displayed) Labs Reviewed - No data to display  EKG  EKG Interpretation None       Radiology Dg Ribs Unilateral W/chest Left  Result Date: 07/12/2016 CLINICAL DATA:  Status post fall, with left anterior rib pain. Initial encounter. EXAM: LEFT RIBS AND CHEST - 3+ VIEW COMPARISON:  Chest radiograph performed 03/28/2015 FINDINGS: There appear to be minimally displaced fractures of the left anterolateral fifth through seventh ribs. The lungs are well-aerated. Peribronchial thickening is  noted. There is no evidence of focal opacification, pleural effusion or pneumothorax. The cardiomediastinal silhouette is borderline normal in size. No additional osseous abnormalities are seen. IMPRESSION: 1. Peribronchial thickening noted.  Lungs otherwise grossly clear. 2. Minimally displaced fractures of the left anterolateral fifth through seventh ribs. Electronically Signed   By: Garald Balding M.D.   On: 07/12/2016 22:01   Ct Head Wo Contrast  Result Date: 07/12/2016 CLINICAL DATA:  76 y/o  F; fall with head injury. EXAM: CT HEAD WITHOUT CONTRAST TECHNIQUE: Contiguous axial images were obtained from the base of the skull  through the vertex without intravenous contrast. COMPARISON:  03/28/2015 CT head FINDINGS: Brain: No evidence of acute infarction, hemorrhage, hydrocephalus, extra-axial collection or mass lesion/mass effect. Stable mild chronic microvascular ischemic changes and parenchymal volume loss of the brain. Vascular: Extensive calcific atherosclerosis of cavernous and paraclinoid internal carotid arteries. Skull: Normal. Negative for fracture or focal lesion. Sinuses/Orbits: No significant paranasal sinus mucosal thickening. Normal aeration of mastoid air cells. Bilateral intra-ocular lens replacement. Other: None. IMPRESSION: 1. No acute intracranial abnormality identified. 2. Stable mild chronic microvascular ischemic changes and parenchymal volume loss of the brain. Electronically Signed   By: Kristine Garbe M.D.   On: 07/12/2016 23:38    Procedures Procedures (including critical care time)  Medications Ordered in ED Medications  Tdap (BOOSTRIX) injection 0.5 mL (0.5 mLs Intramuscular Given 07/12/16 2221)  HYDROcodone-acetaminophen (NORCO/VICODIN) 5-325 MG per tablet 1 tablet (1 tablet Oral Given 07/12/16 2119)  HYDROcodone-acetaminophen (NORCO/VICODIN) 5-325 MG per tablet 1 tablet (1 tablet Oral Given 07/12/16 2239)     Initial Impression / Assessment and Plan / ED Course  I have reviewed the triage vital signs and the nursing notes.  Pertinent labs & imaging results that were available during my care of the patient were reviewed by me and considered in my medical decision making (see chart for details).      3 rib fractures on x ray  CT Head normal  Given Rx for Norco 1-2 every 4-6 hours and incentive spirometer  Follow up with PCP  Final Clinical Impressions(s) / ED Diagnoses   Final diagnoses:  Contusion of other part of head, initial encounter  Closed fracture of multiple ribs of left side, initial encounter    New Prescriptions New Prescriptions   HYDROCODONE-ACETAMINOPHEN  (NORCO/VICODIN) 5-325 MG TABLET    Take 1-2 tablets by mouth every 4 (four) hours as needed for moderate pain.     Junius Creamer, NP 07/13/16 1975    Merrily Pew, MD 07/13/16 [phone]

## 2016-07-13 MED ORDER — HYDROCODONE-ACETAMINOPHEN 5-325 MG PO TABS: 1.0000 | ORAL_TABLET | ORAL | 0 refills | Status: DC | PRN

## 2016-07-13 NOTE — ED Notes (Signed)
Pt given incentive spirometer and instructed on proper use. Pt able to breathe in and bubble went as high as 1650.

## 2016-07-13 NOTE — Discharge Instructions (Signed)
As discussed, you have 3.  Rib fractures on the left.  You've been given hydrocodone for pain control.  He can intersperse this with ibuprofen or Aleve for better pain control.  You've also been given an incentive spirometer to take deep breaths, 3-4 deep breaths every hour while awake.  Make an appointment to follow-up with your PCP

## 2016-07-13 NOTE — ED Notes (Signed)
Pt verbalized understanding of d/c instructions and has no further questions. Pt stable and NAD. VSS. Pt instructed on proper use of incentive spirometer.

## 2016-07-17 DIAGNOSIS — Z7901 Long term (current) use of anticoagulants: Secondary | ICD-10-CM | POA: Diagnosis not present

## 2016-07-17 DIAGNOSIS — S2239XA Fracture of one rib, unspecified side, initial encounter for closed fracture: Secondary | ICD-10-CM | POA: Diagnosis not present

## 2016-07-26 DIAGNOSIS — M5416 Radiculopathy, lumbar region: Secondary | ICD-10-CM | POA: Diagnosis not present

## 2016-08-14 DIAGNOSIS — Z7901 Long term (current) use of anticoagulants: Secondary | ICD-10-CM | POA: Diagnosis not present

## 2016-08-29 DIAGNOSIS — H16201 Unspecified keratoconjunctivitis, right eye: Secondary | ICD-10-CM | POA: Diagnosis not present

## 2016-09-11 DIAGNOSIS — Z7901 Long term (current) use of anticoagulants: Secondary | ICD-10-CM | POA: Diagnosis not present

## 2016-09-22 DIAGNOSIS — H16201 Unspecified keratoconjunctivitis, right eye: Secondary | ICD-10-CM | POA: Diagnosis not present

## 2016-10-09 DIAGNOSIS — Z7901 Long term (current) use of anticoagulants: Secondary | ICD-10-CM | POA: Diagnosis not present

## 2016-10-18 DIAGNOSIS — I2510 Atherosclerotic heart disease of native coronary artery without angina pectoris: Secondary | ICD-10-CM | POA: Diagnosis not present

## 2016-10-18 DIAGNOSIS — Z0000 Encounter for general adult medical examination without abnormal findings: Secondary | ICD-10-CM | POA: Diagnosis not present

## 2016-10-18 DIAGNOSIS — N390 Urinary tract infection, site not specified: Secondary | ICD-10-CM | POA: Diagnosis not present

## 2016-10-18 DIAGNOSIS — I10 Essential (primary) hypertension: Secondary | ICD-10-CM | POA: Diagnosis not present

## 2016-10-18 DIAGNOSIS — I679 Cerebrovascular disease, unspecified: Secondary | ICD-10-CM | POA: Diagnosis not present

## 2016-10-18 DIAGNOSIS — Z1389 Encounter for screening for other disorder: Secondary | ICD-10-CM | POA: Diagnosis not present

## 2016-10-27 DIAGNOSIS — J0100 Acute maxillary sinusitis, unspecified: Secondary | ICD-10-CM | POA: Diagnosis not present

## 2016-11-06 DIAGNOSIS — Z7901 Long term (current) use of anticoagulants: Secondary | ICD-10-CM | POA: Diagnosis not present

## 2016-11-08 DIAGNOSIS — M5416 Radiculopathy, lumbar region: Secondary | ICD-10-CM | POA: Diagnosis not present

## 2016-11-24 DIAGNOSIS — M1612 Unilateral primary osteoarthritis, left hip: Secondary | ICD-10-CM | POA: Diagnosis not present

## 2016-11-26 DIAGNOSIS — I10 Essential (primary) hypertension: Secondary | ICD-10-CM | POA: Diagnosis not present

## 2016-11-26 DIAGNOSIS — R079 Chest pain, unspecified: Secondary | ICD-10-CM | POA: Diagnosis not present

## 2016-11-26 DIAGNOSIS — R918 Other nonspecific abnormal finding of lung field: Secondary | ICD-10-CM | POA: Diagnosis not present

## 2016-11-26 DIAGNOSIS — R51 Headache: Secondary | ICD-10-CM | POA: Diagnosis not present

## 2016-11-27 DIAGNOSIS — I2510 Atherosclerotic heart disease of native coronary artery without angina pectoris: Secondary | ICD-10-CM | POA: Diagnosis not present

## 2016-11-27 DIAGNOSIS — Z79899 Other long term (current) drug therapy: Secondary | ICD-10-CM | POA: Diagnosis not present

## 2016-11-27 DIAGNOSIS — I10 Essential (primary) hypertension: Secondary | ICD-10-CM | POA: Diagnosis not present

## 2016-11-27 DIAGNOSIS — R51 Headache: Secondary | ICD-10-CM | POA: Diagnosis not present

## 2016-11-27 DIAGNOSIS — E876 Hypokalemia: Secondary | ICD-10-CM | POA: Diagnosis not present

## 2016-11-27 DIAGNOSIS — Z91041 Radiographic dye allergy status: Secondary | ICD-10-CM | POA: Diagnosis not present

## 2016-11-27 DIAGNOSIS — J189 Pneumonia, unspecified organism: Secondary | ICD-10-CM | POA: Diagnosis not present

## 2016-11-27 DIAGNOSIS — M1612 Unilateral primary osteoarthritis, left hip: Secondary | ICD-10-CM | POA: Diagnosis not present

## 2016-11-27 DIAGNOSIS — I69954 Hemiplegia and hemiparesis following unspecified cerebrovascular disease affecting left non-dominant side: Secondary | ICD-10-CM | POA: Diagnosis not present

## 2016-11-27 DIAGNOSIS — Z8679 Personal history of other diseases of the circulatory system: Secondary | ICD-10-CM | POA: Diagnosis not present

## 2016-11-27 DIAGNOSIS — Z888 Allergy status to other drugs, medicaments and biological substances status: Secondary | ICD-10-CM | POA: Diagnosis not present

## 2016-11-27 DIAGNOSIS — Z7902 Long term (current) use of antithrombotics/antiplatelets: Secondary | ICD-10-CM | POA: Diagnosis not present

## 2016-11-27 DIAGNOSIS — I5031 Acute diastolic (congestive) heart failure: Secondary | ICD-10-CM | POA: Diagnosis not present

## 2016-11-27 DIAGNOSIS — G20 Parkinson's disease: Secondary | ICD-10-CM | POA: Diagnosis not present

## 2016-11-27 DIAGNOSIS — Z882 Allergy status to sulfonamides status: Secondary | ICD-10-CM | POA: Diagnosis not present

## 2016-11-27 DIAGNOSIS — Z8673 Personal history of transient ischemic attack (TIA), and cerebral infarction without residual deficits: Secondary | ICD-10-CM | POA: Diagnosis not present

## 2016-11-27 DIAGNOSIS — Z23 Encounter for immunization: Secondary | ICD-10-CM | POA: Diagnosis not present

## 2016-11-27 DIAGNOSIS — R079 Chest pain, unspecified: Secondary | ICD-10-CM | POA: Diagnosis not present

## 2016-11-27 DIAGNOSIS — I110 Hypertensive heart disease with heart failure: Secondary | ICD-10-CM | POA: Diagnosis not present

## 2016-11-27 DIAGNOSIS — R7989 Other specified abnormal findings of blood chemistry: Secondary | ICD-10-CM | POA: Diagnosis not present

## 2016-11-27 DIAGNOSIS — I160 Hypertensive urgency: Secondary | ICD-10-CM | POA: Diagnosis not present

## 2016-11-27 DIAGNOSIS — E785 Hyperlipidemia, unspecified: Secondary | ICD-10-CM | POA: Diagnosis not present

## 2016-11-27 DIAGNOSIS — R918 Other nonspecific abnormal finding of lung field: Secondary | ICD-10-CM | POA: Diagnosis not present

## 2016-11-28 DIAGNOSIS — R079 Chest pain, unspecified: Secondary | ICD-10-CM | POA: Diagnosis not present

## 2016-11-28 DIAGNOSIS — E876 Hypokalemia: Secondary | ICD-10-CM | POA: Diagnosis not present

## 2016-11-28 DIAGNOSIS — I160 Hypertensive urgency: Secondary | ICD-10-CM | POA: Diagnosis not present

## 2016-11-29 DIAGNOSIS — G20 Parkinson's disease: Secondary | ICD-10-CM | POA: Diagnosis not present

## 2016-11-29 DIAGNOSIS — E876 Hypokalemia: Secondary | ICD-10-CM | POA: Diagnosis not present

## 2016-11-29 DIAGNOSIS — R079 Chest pain, unspecified: Secondary | ICD-10-CM | POA: Diagnosis not present

## 2016-11-29 DIAGNOSIS — M1612 Unilateral primary osteoarthritis, left hip: Secondary | ICD-10-CM | POA: Diagnosis not present

## 2016-11-29 DIAGNOSIS — Z8679 Personal history of other diseases of the circulatory system: Secondary | ICD-10-CM | POA: Diagnosis not present

## 2016-11-29 DIAGNOSIS — I110 Hypertensive heart disease with heart failure: Secondary | ICD-10-CM | POA: Diagnosis not present

## 2016-11-29 DIAGNOSIS — R7989 Other specified abnormal findings of blood chemistry: Secondary | ICD-10-CM | POA: Diagnosis not present

## 2016-11-29 DIAGNOSIS — I10 Essential (primary) hypertension: Secondary | ICD-10-CM | POA: Diagnosis not present

## 2016-11-29 DIAGNOSIS — Z8673 Personal history of transient ischemic attack (TIA), and cerebral infarction without residual deficits: Secondary | ICD-10-CM | POA: Diagnosis not present

## 2016-11-29 DIAGNOSIS — J189 Pneumonia, unspecified organism: Secondary | ICD-10-CM | POA: Diagnosis not present

## 2016-11-29 DIAGNOSIS — E785 Hyperlipidemia, unspecified: Secondary | ICD-10-CM | POA: Diagnosis not present

## 2016-11-29 DIAGNOSIS — I160 Hypertensive urgency: Secondary | ICD-10-CM | POA: Diagnosis not present

## 2016-12-04 DIAGNOSIS — I10 Essential (primary) hypertension: Secondary | ICD-10-CM | POA: Diagnosis not present

## 2016-12-11 DIAGNOSIS — Z7901 Long term (current) use of anticoagulants: Secondary | ICD-10-CM | POA: Diagnosis not present

## 2017-01-04 DIAGNOSIS — Z7901 Long term (current) use of anticoagulants: Secondary | ICD-10-CM | POA: Diagnosis not present

## 2017-01-04 DIAGNOSIS — Z5181 Encounter for therapeutic drug level monitoring: Secondary | ICD-10-CM | POA: Diagnosis not present

## 2017-01-08 DIAGNOSIS — Z7901 Long term (current) use of anticoagulants: Secondary | ICD-10-CM | POA: Diagnosis not present

## 2017-01-27 DIAGNOSIS — R0602 Shortness of breath: Secondary | ICD-10-CM | POA: Diagnosis not present

## 2017-01-27 DIAGNOSIS — R51 Headache: Secondary | ICD-10-CM | POA: Diagnosis not present

## 2017-01-27 DIAGNOSIS — I10 Essential (primary) hypertension: Secondary | ICD-10-CM | POA: Diagnosis not present

## 2017-01-28 DIAGNOSIS — R0602 Shortness of breath: Secondary | ICD-10-CM | POA: Diagnosis not present

## 2017-01-29 DIAGNOSIS — I10 Essential (primary) hypertension: Secondary | ICD-10-CM | POA: Diagnosis not present

## 2017-02-05 DIAGNOSIS — Z7901 Long term (current) use of anticoagulants: Secondary | ICD-10-CM | POA: Diagnosis not present

## 2017-03-05 DIAGNOSIS — Z7901 Long term (current) use of anticoagulants: Secondary | ICD-10-CM | POA: Diagnosis not present

## 2017-03-07 DIAGNOSIS — I10 Essential (primary) hypertension: Secondary | ICD-10-CM | POA: Diagnosis not present

## 2017-03-16 DIAGNOSIS — R350 Frequency of micturition: Secondary | ICD-10-CM | POA: Diagnosis not present

## 2017-04-02 DIAGNOSIS — Z7901 Long term (current) use of anticoagulants: Secondary | ICD-10-CM | POA: Diagnosis not present

## 2017-04-05 DIAGNOSIS — M25552 Pain in left hip: Secondary | ICD-10-CM | POA: Diagnosis not present

## 2017-04-05 DIAGNOSIS — M7062 Trochanteric bursitis, left hip: Secondary | ICD-10-CM | POA: Diagnosis not present

## 2017-04-05 DIAGNOSIS — M5416 Radiculopathy, lumbar region: Secondary | ICD-10-CM | POA: Diagnosis not present

## 2017-04-12 DIAGNOSIS — M25552 Pain in left hip: Secondary | ICD-10-CM | POA: Diagnosis not present

## 2017-04-13 DIAGNOSIS — M7060 Trochanteric bursitis, unspecified hip: Secondary | ICD-10-CM | POA: Diagnosis not present

## 2017-04-13 DIAGNOSIS — M5416 Radiculopathy, lumbar region: Secondary | ICD-10-CM | POA: Diagnosis not present

## 2017-04-20 DIAGNOSIS — M47817 Spondylosis without myelopathy or radiculopathy, lumbosacral region: Secondary | ICD-10-CM | POA: Diagnosis not present

## 2017-04-20 DIAGNOSIS — M5416 Radiculopathy, lumbar region: Secondary | ICD-10-CM | POA: Diagnosis not present

## 2017-04-20 DIAGNOSIS — I10 Essential (primary) hypertension: Secondary | ICD-10-CM | POA: Diagnosis not present

## 2017-04-20 DIAGNOSIS — I2510 Atherosclerotic heart disease of native coronary artery without angina pectoris: Secondary | ICD-10-CM | POA: Diagnosis not present

## 2017-04-20 DIAGNOSIS — I252 Old myocardial infarction: Secondary | ICD-10-CM | POA: Diagnosis not present

## 2017-04-30 DIAGNOSIS — Z7901 Long term (current) use of anticoagulants: Secondary | ICD-10-CM | POA: Diagnosis not present

## 2017-05-02 DIAGNOSIS — N289 Disorder of kidney and ureter, unspecified: Secondary | ICD-10-CM | POA: Diagnosis not present

## 2017-05-02 DIAGNOSIS — R350 Frequency of micturition: Secondary | ICD-10-CM | POA: Diagnosis not present

## 2017-05-10 DIAGNOSIS — N289 Disorder of kidney and ureter, unspecified: Secondary | ICD-10-CM | POA: Diagnosis not present

## 2017-05-14 DIAGNOSIS — Z7901 Long term (current) use of anticoagulants: Secondary | ICD-10-CM | POA: Diagnosis not present

## 2017-05-14 DIAGNOSIS — B379 Candidiasis, unspecified: Secondary | ICD-10-CM | POA: Diagnosis not present

## 2017-05-14 DIAGNOSIS — I10 Essential (primary) hypertension: Secondary | ICD-10-CM | POA: Diagnosis not present

## 2017-05-14 DIAGNOSIS — I639 Cerebral infarction, unspecified: Secondary | ICD-10-CM | POA: Diagnosis not present

## 2017-05-21 DIAGNOSIS — I119 Hypertensive heart disease without heart failure: Secondary | ICD-10-CM | POA: Insufficient documentation

## 2017-05-22 NOTE — Progress Notes (Signed)
Cardiology Office Note:    Date:  05/23/2017   ID:  Diana Simmons, DOB Feb 04, 1941, MRN [id number]  PCP:  Townsend Roger, MD  Cardiologist:  Shirlee More, MD    Referring MD: Diana Simmons, Diana Cornea, MD   Stable continue current medical treatment  ASSESSMENT:    1. Essential hypertension   2. Coronary artery disease involving native coronary artery of native heart with angina pectoris ([place])   3. Chronic anticoagulation   4. Pure hypercholesterolemia    PLAN:    In order of problems listed above:  1. She has had severe hypokalemia and will discontinue her thiazide diuretic blood pressure is at target at home and if she is systolics are greater than 190 she will simply take an extra tablet of Coreg as needed.  She will continue to follow home blood pressure once twice daily at rest. 2. Stable continue current medical treatment she supervises me at the end of the visit telling me she had a hospitalization September 2018 at [facility] had a stress test and echocardiogram performed and asked me to review the studies I cannot access those records electronically I have requested them and will see her back to my office in 4 weeks continue current medical treatment she is stable and not having angina 3. Continue warfarin managed by her PCP 4. Stable continue her statin 5. She is apprised made in the visit asking if she has heart failure she has never had the diagnosis does not take a loop diuretic and I will await records from test done at [facility] we will recheck renal function potassium and also do a proBNP level.   Next appointment: 3 weeks   Medication Adjustments/Labs and Tests Ordered: Current medicines are reviewed at length with the patient today.  Concerns regarding medicines are outlined above.  Orders Placed This Encounter  Procedures  . Pro b natriuretic peptide (BNP)  . EKG 12-Lead   No orders of the defined types were placed in this encounter.   Chief Complaint    Patient presents with  . New Patient (Initial Visit)    to re establish with Dr Diana Simmons.  Pt was last seen in 2016  . Hypertension    uncontrolled, [facility] ED visit November 2018    History of Present Illness:    Diana Simmons is a 77 y.o. female with a hx of STEMI, CAD, Dyslipidemia, Peripheral Vascular Disease and stroke with CEA    last seen 11/23/14.  Seen in the ED at [facility] twice Nov and febr with elevated BP. Compliance with diet, lifestyle and medications: Yes She presents my office concerned about her blood pressure her primary concern is that is been low she told me she has had systolics in the 28U and 13K on further questioning this was in the past when she took a medication for urinary incontinence.  Her home blood pressures have typically been running in the range of 440 systolic over 10-27.  She intermittently takes a diuretic more for peripheral edema she had a serum potassium of 2.6 in the emergency room and is unsure she has had any follow-up.  Her big concern is what to do if her blood pressure is elevated she has a prescription for clonidine she is never taken and I gave her instructions for extra carvedilol as needed systolic blood pressure greater than 190.  She is not having chest pain shortness of breath palpitation or syncope. Past Medical History:  Diagnosis Date  .  Cerebrovascular accident (CVA) ([place]) 02/15/2016  . Chronic anticoagulation 11/22/2014  . Coronary artery disease   . Coronary artery disease involving native coronary artery of native heart with angina pectoris ([place]) 11/22/2014   Overview:  Diffuse disease not amenable to PCI  . Falls, sequela 02/15/2016  . H/O carotid endarterectomy 02/15/2016  . Hypertension   . Lack of coordination 09/24/2012  . Parkinson's disease ([place]) 09/24/2012  . Stroke ([place])   . Tremor 02/15/2016    Past Surgical History:  Procedure Laterality Date  . ABDOMINAL AORTIC ANEURYSM REPAIR    . ABDOMINAL HYSTERECTOMY    . CARDIAC SURGERY     . CAROTID ENDARTERECTOMY    . CHOLECYSTECTOMY    . HERNIA REPAIR      Current Medications: Current Meds  Medication Sig  . atorvastatin (LIPITOR) 40 MG tablet Take 40 mg by mouth daily.  . carvedilol (COREG) 3.125 MG tablet Take 3.125 mg by mouth 2 (two) times daily.  . citalopram (CELEXA) 40 MG tablet Take 40 mg by mouth daily.  . fluticasone (FLONASE) 50 MCG/ACT nasal spray Place 1 spray into both nostrils 2 (two) times daily.  Marland Kitchen omeprazole (PRILOSEC) 20 MG capsule Take 20 mg by mouth daily.  . potassium chloride SA (K-DUR,KLOR-CON) 20 MEQ tablet Take 20 mEq by mouth daily.  . pramipexole (MIRAPEX) 1 MG tablet Take 1 mg by mouth 2 (two) times daily.  . traMADol (ULTRAM) 50 MG tablet Take 50 mg by mouth every 6 (six) hours as needed.  . Vitamin D, Ergocalciferol, (DRISDOL) 50000 units CAPS capsule Take 50,000 Units by mouth every 14 (fourteen) days.  Marland Kitchen warfarin (COUMADIN) 5 MG tablet Take 5 mg by mouth as directed. Currently taking 6.5 mg everyday except 5 mg on Mon, Wed, and Fri  . [DISCONTINUED] hydrochlorothiazide (HYDRODIURIL) 12.5 MG tablet Take 6.25 mg by mouth daily.      Allergies:   Amlodipine; Aspirin-dipyridamole er; Atorvastatin; Benadryl  [diphenhydramine]; Clopidogrel; Dipyridamole; Ivp dye [iodinated diagnostic agents]; Lovastatin; Simvastatin; Sulfa antibiotics; and Rasagiline   Social History   Socioeconomic History  . Marital status: Married    Spouse name: None  . Number of children: None  . Years of education: None  . Highest education level: None  Social Needs  . Financial resource strain: None  . Food insecurity - worry: None  . Food insecurity - inability: None  . Transportation needs - medical: None  . Transportation needs - non-medical: None  Occupational History  . None  Tobacco Use  . Smoking status: Former Smoker    Last attempt to quit: 1990    Years since quitting: 29.2  . Smokeless tobacco: Never Used  Substance and Sexual Activity  .  Alcohol use: No    Frequency: Never  . Drug use: No  . Sexual activity: None  Other Topics Concern  . None  Social History Narrative  . None     Family History: The patient's family history includes Alzheimer's disease in her sister; Bladder Cancer in her brother and brother; CAD in her sister; Congestive Heart Failure in her father; Heart Problems in her sister; Heart attack in her mother. ROS:   Please see the history of present illness.    All other systems reviewed and are negative.  EKGs/Labs/Other Studies Reviewed:    The following studies were reviewed today:  EKG:  EKG ordered today.  The ekg ordered today demonstrates [place] normal  Recent Labs: K 2.6 01/28/17, BNP 3300,Hgb 10.1  No results found  for requested labs within last 8760 hours.  Recent Lipid Panel No results found for: CHOL, TRIG, HDL, CHOLHDL, VLDL, LDLCALC, LDLDIRECT  Physical Exam:    VS:  BP 118/72 (BP Location: Right Arm, Patient Position: Sitting, Cuff Size: Large)   Pulse 60   Ht 5\' 1"  (1.549 m)   Wt 176 lb 12.8 oz (80.2 kg)   SpO2 98%   BMI 33.41 kg/m     Wt Readings from Last 3 Encounters:  05/23/17 176 lb 12.8 oz (80.2 kg)     GEN:  Well nourished, well developed in no acute distress HEENT: Normal NECK: No JVD; No carotid bruits LYMPHATICS: No lymphadenopathy CARDIAC: RRR, no murmurs, rubs, gallops RESPIRATORY:  Clear to auscultation without rales, wheezing or rhonchi  ABDOMEN: Soft, non-tender, non-distended MUSCULOSKELETAL:  No edema; No deformity  SKIN: Warm and dry NEUROLOGIC:  Alert and oriented x 3 PSYCHIATRIC:  Normal affect    Signed, Shirlee More, MD  05/23/2017 5:28 PM    [facility]

## 2017-05-23 ENCOUNTER — Ambulatory Visit: Payer: Medicare Other | Admitting: Cardiology

## 2017-05-23 ENCOUNTER — Encounter: Payer: Self-pay | Admitting: Cardiology

## 2017-05-23 VITALS — BP 118/72 | HR 60 | Ht 61.0 in | Wt 176.8 lb

## 2017-05-23 DIAGNOSIS — I10 Essential (primary) hypertension: Secondary | ICD-10-CM | POA: Diagnosis not present

## 2017-05-23 DIAGNOSIS — I25119 Atherosclerotic heart disease of native coronary artery with unspecified angina pectoris: Secondary | ICD-10-CM | POA: Diagnosis not present

## 2017-05-23 DIAGNOSIS — E7800 Pure hypercholesterolemia, unspecified: Secondary | ICD-10-CM

## 2017-05-23 DIAGNOSIS — Z7901 Long term (current) use of anticoagulants: Secondary | ICD-10-CM

## 2017-05-23 DIAGNOSIS — R609 Edema, unspecified: Secondary | ICD-10-CM | POA: Diagnosis not present

## 2017-05-23 DIAGNOSIS — I119 Hypertensive heart disease without heart failure: Secondary | ICD-10-CM | POA: Diagnosis not present

## 2017-05-23 DIAGNOSIS — E785 Hyperlipidemia, unspecified: Secondary | ICD-10-CM | POA: Insufficient documentation

## 2017-05-23 NOTE — Patient Instructions (Addendum)
Medication Instructions:  Your physician has recommended you make the following change in your medication:  STOP hydrochlorothiazide STOP clonidine  Take an extra dose of carvedilol 3.125 mg once daily if blood pressure greater than 190  Check your BP twice daily and record  Labwork: Your physician recommends that you have the following labs drawn: BNP  Testing/Procedures: You had an EKG today.  Follow-Up: Your physician recommends that you schedule a follow-up appointment in: 3 weeks.  Any Other Special Instructions Will Be Listed Below (If Applicable).     If you need a refill on your cardiac medications before your next appointment, please call your pharmacy.      DASH diet: Healthy eating to lower your blood pressure The DASH diet emphasizes portion size, eating a variety of foods and getting the right amount of nutrients. Discover how DASH can improve your health and lower your blood pressure. By [facility] Staff  DASH stands for Dietary Approaches to Stop Hypertension. The DASH diet is a lifelong approach to healthy eating that's designed to help treat or prevent high blood pressure (hypertension). The DASH diet encourages you to reduce the sodium in your diet and eat a variety of foods rich in nutrients that help lower blood pressure, such as potassium, calcium and magnesium. By following the DASH diet, you may be able to reduce your blood pressure by a few points in just two weeks. Over time, your systolic blood pressure could drop by eight to 14 points, which can make a significant difference in your health risks. Because the DASH diet is a healthy way of eating, it offers health benefits besides just lowering blood pressure. The DASH diet is also in line with dietary recommendations to prevent osteoporosis, cancer, heart disease, stroke and diabetes. DASH diet: Sodium levels The DASH diet emphasizes vegetables, fruits and low-fat dairy foods - and moderate amounts of  whole grains, fish, poultry and nuts. In addition to the standard DASH diet, there is also a lower sodium version of the diet. You can choose the version of the diet that meets your health needs: Standard DASH diet. You can consume up to 2,300 milligrams (mg) of sodium a day.  Lower sodium DASH diet. You can consume up to 1,500 mg of sodium a day. Both versions of the DASH diet aim to reduce the amount of sodium in your diet compared with what you might get in a typical American diet, which can amount to a whopping 3,400 mg of sodium a day or more. The standard DASH diet meets the recommendation from the Dietary Guidelines for Americans to keep daily sodium intake to less than 2,300 mg a day. The American Heart Association recommends 1,500 mg a day of sodium as an upper limit for all adults. If you aren't sure what sodium level is right for you, talk to your doctor. DASH diet: What to eat Both versions of the DASH diet include lots of whole grains, fruits, vegetables and low-fat dairy products. The DASH diet also includes some fish, poultry and legumes, and encourages a small amount of nuts and seeds a few times a week.  You can eat red meat, sweets and fats in small amounts. The DASH diet is low in saturated fat, cholesterol and total fat. Here's a look at the recommended servings from each food group for the 2,000-calorie-a-day DASH diet. Grains: 6 to 8 servings a day Grains include bread, cereal, rice and pasta. Examples of one serving of grains include 1 slice whole-wheat  bread, 1 ounce dry cereal, or 1/2 cup cooked cereal, rice or pasta. Focus on whole grains because they have more fiber and nutrients than do refined grains. For instance, use brown rice instead of white rice, whole-wheat pasta instead of regular pasta and whole-grain bread instead of white bread. Look for products labeled "100 percent whole grain" or "100 percent whole wheat."  Grains are naturally low in fat. Keep them this way  by avoiding butter, cream and cheese sauces. Vegetables: 4 to 5 servings a day Tomatoes, carrots, broccoli, sweet potatoes, greens and other vegetables are full of fiber, vitamins, and such minerals as potassium and magnesium. Examples of one serving include 1 cup raw leafy green vegetables or 1/2 cup cut-up raw or cooked vegetables. Don't think of vegetables only as side dishes - a hearty blend of vegetables served over brown rice or whole-wheat noodles can serve as the main dish for a meal.  Fresh and frozen vegetables are both good choices. When buying frozen and canned vegetables, choose those labeled as low sodium or without added salt.  To increase the number of servings you fit in daily, be creative. In a stir-fry, for instance, cut the amount of meat in half and double up on the vegetables. Fruits: 4 to 5 servings a day Many fruits need little preparation to become a healthy part of a meal or snack. Like vegetables, they're packed with fiber, potassium and magnesium and are typically low in fat - coconuts are an exception. Examples of one serving include one medium fruit, 1/2 cup fresh, frozen or canned fruit, or 4 ounces of juice. Have a piece of fruit with meals and one as a snack, then round out your day with a dessert of fresh fruits topped with a dollop of low-fat yogurt.  Leave on edible peels whenever possible. The peels of apples, pears and most fruits with pits add interesting texture to recipes and contain healthy nutrients and fiber.  Remember that citrus fruits and juices, such as grapefruit, can interact with certain medications, so check with your doctor or pharmacist to see if they're OK for you.  If you choose canned fruit or juice, make sure no sugar is added. Dairy: 2 to 3 servings a day Milk, yogurt, cheese and other dairy products are major sources of calcium, vitamin D and protein. But the key is to make sure that you choose dairy products that are low fat or fat-free  because otherwise they can be a major source of fat - and most of it is saturated. Examples of one serving include 1 cup skim or 1 percent milk, 1 cup low fat yogurt, or 1 1/2 ounces part-skim cheese. Low-fat or fat-free frozen yogurt can help you boost the amount of dairy products you eat while offering a sweet treat. Add fruit for a healthy twist.  If you have trouble digesting dairy products, choose lactose-free products or consider taking an over-the-counter product that contains the enzyme lactase, which can reduce or prevent the symptoms of lactose intolerance.  Go easy on regular and even fat-free cheeses because they are typically high in sodium. Lean meat, poultry and fish: 6 servings or fewer a day Meat can be a rich source of protein, B vitamins, iron and zinc. Choose lean varieties and aim for no more than 6 ounces a day. Cutting back on your meat portion will allow room for more vegetables. Trim away skin and fat from poultry and meat and then bake, broil, grill or roast  instead of frying in fat.  Eat heart-healthy fish, such as salmon, herring and tuna. These types of fish are high in omega-3 fatty acids, which can help lower your total cholesterol. Nuts, seeds and legumes: 4 to 5 servings a week Almonds, sunflower seeds, kidney beans, peas, lentils and other foods in this family are good sources of magnesium, potassium and protein. They're also full of fiber and phytochemicals, which are plant compounds that may protect against some cancers and cardiovascular disease. Serving sizes are small and are intended to be consumed only a few times a week because these foods are high in calories. Examples of one serving include 1/3 cup nuts, 2 tablespoons seeds, or 1/2 cup cooked beans or peas.  Nuts sometimes get a bad rap because of their fat content, but they contain healthy types of fat - monounsaturated fat and omega-3 fatty acids. They're high in calories, however, so eat them in moderation.  Try adding them to stir-fries, salads or cereals.  Soybean-based products, such as tofu and tempeh, can be a good alternative to meat because they contain all of the amino acids your body needs to make a complete protein, just like meat. Fats and oils: 2 to 3 servings a day Fat helps your body absorb essential vitamins and helps your body's immune system. But too much fat increases your risk of heart disease, diabetes and obesity. The DASH diet strives for a healthy balance by limiting total fat to less than 30 percent of daily calories from fat, with a focus on the healthier monounsaturated fats. Examples of one serving include 1 teaspoon soft margarine, 1 tablespoon mayonnaise or 2 tablespoons salad dressing. Saturated fat and trans fat are the main dietary culprits in increasing your risk of coronary artery disease. DASH helps keep your daily saturated fat to less than 6 percent of your total calories by limiting use of meat, butter, cheese, whole milk, cream and eggs in your diet, along with foods made from lard, solid shortenings, and palm and coconut oils.  Avoid trans fat, commonly found in such processed foods as crackers, baked goods and fried items.  Read food labels on margarine and salad dressing so that you can choose those that are lowest in saturated fat and free of trans fat. Sweets: 5 servings or fewer a week You don't have to banish sweets entirely while following the DASH diet - just go easy on them. Examples of one serving include 1 tablespoon sugar, jelly or jam, 1/2 cup sorbet, or 1 cup lemonade. When you eat sweets, choose those that are fat-free or low-fat, such as sorbets, fruit ices, jelly beans, hard candy, graham crackers or low-fat cookies.  Artificial sweeteners such as aspartame (NutraSweet, Equal) and sucralose (Splenda) may help satisfy your sweet tooth while sparing the sugar. But remember that you still must use them sensibly. It's OK to swap a diet cola for a regular  cola, but not in place of a more nutritious beverage such as low-fat milk or even plain water.  Cut back on added sugar, which has no nutritional value but can pack on calories. DASH diet: Alcohol and caffeine Drinking too much alcohol can increase blood pressure. The Dietary Guidelines for Americans recommends that men limit alcohol to no more than two drinks a day and women to one or less. The DASH diet doesn't address caffeine consumption. The influence of caffeine on blood pressure remains unclear. But caffeine can cause your blood pressure to rise at least temporarily. If you  already have high blood pressure or if you think caffeine is affecting your blood pressure, talk to your doctor about your caffeine consumption. DASH diet and weight loss While the DASH diet is not a weight-loss program, you may indeed lose unwanted pounds because it can help guide you toward healthier food choices. The DASH diet generally includes about 2,000 calories a day. If you're trying to lose weight, you may need to eat fewer calories. You may also need to adjust your serving goals based on your individual circumstances - something your health care team can help you decide. Tips to cut back on sodium The foods at the core of the DASH diet are naturally low in sodium. So just by following the DASH diet, you're likely to reduce your sodium intake. You also reduce sodium further by: Using sodium-free spices or flavorings with your food instead of salt  Not adding salt when cooking rice, pasta or hot cereal  Rinsing canned foods to remove some of the sodium  Buying foods labeled "no salt added," "sodium-free," "low sodium" or "very low sodium" One teaspoon of table salt has 2,325 mg of sodium. When you read food labels, you may be surprised at just how much sodium some processed foods contain. Even low-fat soups, canned vegetables, ready-to-eat cereals and sliced [place] from the local deli - foods you may have considered  healthy - often have lots of sodium. You may notice a difference in taste when you choose low-sodium food and beverages. If things seem too bland, gradually introduce low-sodium foods and cut back on table salt until you reach your sodium goal. That'll give your palate time to adjust. Using salt-free seasoning blends or herbs and spices may also ease the transition. It can take several weeks for your taste buds to get used to less salty foods. Putting the pieces of the DASH diet together Try these strategies to get started on the DASH diet:  Change gradually. If you now eat only one or two servings of fruits or vegetables a day, try to add a serving at lunch and one at dinner. Rather than switching to all whole grains, start by making one or two of your grain servings whole grains. Increasing fruits, vegetables and whole grains gradually can also help prevent bloating or diarrhea that may occur if you aren't used to eating a diet with lots of fiber. You can also try over-the-counter products to help reduce gas from beans and vegetables.  Reward successes and forgive slip-ups. Reward yourself with a nonfood treat for your accomplishments - rent a movie, purchase a book or get together with a friend. Everyone slips, especially when learning something new. Remember that changing your lifestyle is a long-term process. Find out what triggered your setback and then just pick up where you left off with the DASH diet.  Add physical activity. To boost your blood pressure lowering efforts even more, consider increasing your physical activity in addition to following the DASH diet. Combining both the DASH diet and physical activity makes it more likely that you'll reduce your blood pressure.  Get support if you need it. If you're having trouble sticking to your diet, talk to your doctor or dietitian about it. You might get some tips that will help you stick to the DASH diet. Remember, healthy eating isn't an  all-or-nothing proposition. What's most important is that, on average, you eat healthier foods with plenty of variety - both to keep your diet nutritious and to avoid boredom or  extremes. And with the DASH diet, you can have both.

## 2017-05-24 LAB — PRO B NATRIURETIC PEPTIDE: NT-Pro BNP: 748 pg/mL — ABNORMAL HIGH (ref 0–738)

## 2017-05-28 DIAGNOSIS — Z7901 Long term (current) use of anticoagulants: Secondary | ICD-10-CM | POA: Diagnosis not present

## 2017-06-06 DIAGNOSIS — I10 Essential (primary) hypertension: Secondary | ICD-10-CM | POA: Diagnosis not present

## 2017-06-12 NOTE — Progress Notes (Signed)
Cardiology Office Note:    Date:  06/13/2017   ID:  Diana Simmons, DOB 1940-07-12, MRN [id number]  PCP:  Townsend Roger, MD  Cardiologist:  Shirlee More, MD    Referring MD: Townsend Roger, MD    ASSESSMENT:    1. Hypertensive heart disease without heart failure   2. Coronary artery disease involving native coronary artery of native heart with angina pectoris ([place])   3. Pure hypercholesterolemia   4. Chronic anticoagulation    PLAN:    In order of problems listed above:  1. Improved home systolic blood pressure running 1 4160 and I will add a low-dose of a distal diuretic with her peripheral edema hypertension and previous severe hypokalemia.  I asked her to have a BMP performed in 2 weeks. 2. Stable continue medical treatment 3. Stable continue statin 4. Continue warfarin with home monitoring   Next appointment: 6 months   Medication Adjustments/Labs and Tests Ordered: Current medicines are reviewed at length with the patient today.  Concerns regarding medicines are outlined above.  No orders of the defined types were placed in this encounter.  No orders of the defined types were placed in this encounter.   No chief complaint on file.   History of Present Illness:    Diana Simmons is a 77 y.o. female with a hx of  STEMI, CAD, Dyslipidemia, Peripheral Vascular Disease and stroke with CEA  last seen 05/23/17.  ASSESSMENT:    05/23/17   1. Essential hypertension   2. Coronary artery disease involving native coronary artery of native heart with angina pectoris ([place])   3. Chronic anticoagulation   4. Pure hypercholesterolemia    PLAN:    1.       She has had severe hypokalemia and will discontinue her thiazide diuretic blood pressure is at target at home and if she is systolics are greater than 190 she will simply take an extra tablet of Coreg as needed.  She will continue to follow home blood pressure once twice daily at rest. 5. Stable continue current medical  treatment she supervises me at the end of the visit telling me she had a hospitalization September 2018 at [facility] had a stress test and echocardiogram performed and asked me to review the studies I cannot access those records electronically I have requested them and will see her back to my office in 4 weeks continue current medical treatment she is stable and not having angina 6. Continue warfarin managed by her PCP 7. Stable continue her statin 8. She surprised me in the visit asking if she has heart failure she has never had the diagnosis does not take a loop diuretic and I will await records from test done at [facility] we will recheck renal function potassium and also do a proBNP level.  Compliance with diet, lifestyle and medications: Yes She is markedly improved her thought processes clear today she no longer feels weak but still complains of intermittent edema and home blood pressure being elevated.  No chest pain shortness of breath palpitation or syncope.  Labs drawn last week pending from her PCP office.  She has been on potassium supplements and off diuretic Past Medical History:  Diagnosis Date  . Cerebrovascular accident (CVA) ([place]) 02/15/2016  . Chronic anticoagulation 11/22/2014  . Coronary artery disease   . Coronary artery disease involving native coronary artery of native heart with angina pectoris ([place]) 11/22/2014   Overview:  Diffuse disease not amenable to  PCI  . Falls, sequela 02/15/2016  . H/O carotid endarterectomy 02/15/2016  . Hypertension   . Lack of coordination 09/24/2012  . Parkinson's disease ([place]) 09/24/2012  . Stroke ([place])   . Tremor 02/15/2016    Past Surgical History:  Procedure Laterality Date  . ABDOMINAL AORTIC ANEURYSM REPAIR    . ABDOMINAL HYSTERECTOMY    . CARDIAC SURGERY    . CAROTID ENDARTERECTOMY    . CHOLECYSTECTOMY    . HERNIA REPAIR      Current Medications: No outpatient medications have been marked as taking for the  06/13/17 encounter (Appointment) with Richardo Priest, MD.     Allergies:   Amlodipine; Aspirin-dipyridamole er; Atorvastatin; Benadryl  [diphenhydramine]; Clopidogrel; Dipyridamole; Ivp dye [iodinated diagnostic agents]; Lovastatin; Simvastatin; Sulfa antibiotics; and Rasagiline   Social History   Socioeconomic History  . Marital status: Married    Spouse name: Not on file  . Number of children: Not on file  . Years of education: Not on file  . Highest education level: Not on file  Occupational History  . Not on file  Social Needs  . Financial resource strain: Not on file  . Food insecurity:    Worry: Not on file    Inability: Not on file  . Transportation needs:    Medical: Not on file    Non-medical: Not on file  Tobacco Use  . Smoking status: Former Smoker    Last attempt to quit: 1990    Years since quitting: 29.2  . Smokeless tobacco: Never Used  Substance and Sexual Activity  . Alcohol use: No    Frequency: Never  . Drug use: No  . Sexual activity: Not on file  Lifestyle  . Physical activity:    Days per week: Not on file    Minutes per session: Not on file  . Stress: Not on file  Relationships  . Social connections:    Talks on phone: Not on file    Gets together: Not on file    Attends religious service: Not on file    Active member of club or organization: Not on file    Attends meetings of clubs or organizations: Not on file    Relationship status: Not on file  Other Topics Concern  . Not on file  Social History Narrative  . Not on file     Family History: The patient's family history includes Alzheimer's disease in her sister; Bladder Cancer in her brother and brother; CAD in her sister; Congestive Heart Failure in her father; Heart Problems in her sister; Heart attack in her mother. ROS:   Please see the history of present illness.    All other systems reviewed and are negative.  EKGs/Labs/Other Studies Reviewed:    The following studies were  reviewed today  Recent Labs: awaiting recent BMP from her PCP 05/23/2017: NT-Pro BNP 748  Recent Lipid Panel No results found for: CHOL, TRIG, HDL, CHOLHDL, VLDL, LDLCALC, LDLDIRECT  Physical Exam:    VS:  There were no vitals taken for this visit.    Wt Readings from Last 3 Encounters:  05/23/17 176 lb 12.8 oz (80.2 kg)     GEN:  Well nourished, well developed in no acute distress HEENT: Normal NECK: No JVD; No carotid bruits LYMPHATICS: No lymphadenopathy CARDIAC: RRR, no murmurs, rubs, gallops RESPIRATORY:  Clear to auscultation without rales, wheezing or rhonchi  ABDOMEN: Soft, non-tender, non-distended MUSCULOSKELETAL:  No edema; No deformity  SKIN: Warm and dry NEUROLOGIC:  Alert and oriented x 3 PSYCHIATRIC:  Normal affect    Signed, Shirlee More, MD  06/13/2017 12:00 PM    [facility]

## 2017-06-13 ENCOUNTER — Encounter: Payer: Self-pay | Admitting: Cardiology

## 2017-06-13 ENCOUNTER — Ambulatory Visit: Payer: Medicare Other | Admitting: Cardiology

## 2017-06-13 VITALS — BP 146/88 | HR 63 | Ht 61.0 in | Wt 180.0 lb

## 2017-06-13 DIAGNOSIS — E7800 Pure hypercholesterolemia, unspecified: Secondary | ICD-10-CM

## 2017-06-13 DIAGNOSIS — I25119 Atherosclerotic heart disease of native coronary artery with unspecified angina pectoris: Secondary | ICD-10-CM

## 2017-06-13 DIAGNOSIS — I119 Hypertensive heart disease without heart failure: Secondary | ICD-10-CM | POA: Diagnosis not present

## 2017-06-13 DIAGNOSIS — Z7901 Long term (current) use of anticoagulants: Secondary | ICD-10-CM | POA: Diagnosis not present

## 2017-06-13 MED ORDER — SPIRONOLACTONE 25 MG PO TABS: 12.5000 mg | ORAL_TABLET | Freq: Every day | ORAL | 11 refills | Status: DC

## 2017-06-13 NOTE — Patient Instructions (Signed)
Medication Instructions:  Your physician has recommended you make the following change in your medication:  START spironolactone (Aldactone) 12.5 mg daily  Labwork: Your physician recommends that you return for lab work in: 2 weeks. BMP  Testing/Procedures: None  Follow-Up: Your physician wants you to follow-up in: 3 months. You will receive a reminder letter in the mail two months in advance. If you don't receive a letter, please call our office to schedule the follow-up appointment.  Any Other Special Instructions Will Be Listed Below (If Applicable).     If you need a refill on your cardiac medications before your next appointment, please call your pharmacy.

## 2017-06-25 DIAGNOSIS — I10 Essential (primary) hypertension: Secondary | ICD-10-CM | POA: Diagnosis not present

## 2017-06-25 DIAGNOSIS — Z7901 Long term (current) use of anticoagulants: Secondary | ICD-10-CM | POA: Diagnosis not present

## 2017-07-02 ENCOUNTER — Telehealth: Payer: Self-pay

## 2017-07-02 NOTE — Telephone Encounter (Signed)
Attempted to contact patient to remind her to have lab work at the [facility] in [place]. Voicemail box full. Will continue efforts.

## 2017-07-05 DIAGNOSIS — I10 Essential (primary) hypertension: Secondary | ICD-10-CM | POA: Diagnosis not present

## 2017-07-17 ENCOUNTER — Telehealth: Payer: Self-pay

## 2017-07-17 NOTE — Telephone Encounter (Signed)
Patient called complaining of blood pressure as high as 975-300 systolic. Dr. Nona Dell put patient on lisinopril 20 mg Friday 07/13/17. Patient does not believe this has worked for her. Patient would like to see Dr. Bettina Gavia regarding blood pressures. Appointment made for 07/20/17 at 10 am. Patient verbalized understanding of appointment date and time and to bring her log of blood pressures to appointment. No further questions.

## 2017-07-19 NOTE — Progress Notes (Deleted)
Cardiology Office Note:    Date:  07/19/2017   ID:  Diana Simmons, DOB 01-15-1941, MRN [id number]  PCP:  Diana Roger, MD  Cardiologist:  Diana More, MD    Referring MD: Diana Simmons, Corene Cornea, MD    ASSESSMENT:    No diagnosis found. PLAN:    In order of problems listed above:  1. ***   Next appointment: ***   Medication Adjustments/Labs and Tests Ordered: Current medicines are reviewed at length with the patient today.  Concerns regarding medicines are outlined above.  No orders of the defined types were placed in this encounter.  No orders of the defined types were placed in this encounter.   No chief complaint on file.   History of Present Illness:    Diana Simmons is a 77 y.o. female with a hx of hypertension STEMI, CAD, Dyslipidemia, Peripheral Vascular Disease and stroke with CEA last seen 06/13/17. Compliance with diet, lifestyle and medications: *** Past Medical History:  Diagnosis Date  . Cerebrovascular accident (CVA) ([place]) 02/15/2016  . Chronic anticoagulation 11/22/2014  . Coronary artery disease   . Coronary artery disease involving native coronary artery of native heart with angina pectoris ([place]) 11/22/2014   Overview:  Diffuse disease not amenable to PCI  . Falls, sequela 02/15/2016  . H/O carotid endarterectomy 02/15/2016  . Hypertension   . Lack of coordination 09/24/2012  . Parkinson's disease ([place]) 09/24/2012  . Stroke ([place])   . Tremor 02/15/2016    Past Surgical History:  Procedure Laterality Date  . ABDOMINAL AORTIC ANEURYSM REPAIR    . ABDOMINAL HYSTERECTOMY    . CARDIAC SURGERY    . CAROTID ENDARTERECTOMY    . CHOLECYSTECTOMY    . HERNIA REPAIR      Current Medications: No outpatient medications have been marked as taking for the 07/20/17 encounter (Appointment) with Richardo Priest, MD.     Allergies:   Amlodipine; Aspirin-dipyridamole er; Atorvastatin; Benadryl  [diphenhydramine]; Clopidogrel; Dipyridamole; Ivp dye [iodinated  diagnostic agents]; Lovastatin; Simvastatin; Sulfa antibiotics; and Rasagiline   Social History   Socioeconomic History  . Marital status: Married    Spouse name: Not on file  . Number of children: Not on file  . Years of education: Not on file  . Highest education level: Not on file  Occupational History  . Not on file  Social Needs  . Financial resource strain: Not on file  . Food insecurity:    Worry: Not on file    Inability: Not on file  . Transportation needs:    Medical: Not on file    Non-medical: Not on file  Tobacco Use  . Smoking status: Former Smoker    Last attempt to quit: 1990    Years since quitting: 29.3  . Smokeless tobacco: Never Used  Substance and Sexual Activity  . Alcohol use: No    Frequency: Never  . Drug use: No  . Sexual activity: Not on file  Lifestyle  . Physical activity:    Days per week: Not on file    Minutes per session: Not on file  . Stress: Not on file  Relationships  . Social connections:    Talks on phone: Not on file    Gets together: Not on file    Attends religious service: Not on file    Active member of club or organization: Not on file    Attends meetings of clubs or organizations: Not on file    Relationship status:  Not on file  Other Topics Concern  . Not on file  Social History Narrative  . Not on file     Family History: The patient's ***family history includes Alzheimer's disease in her sister; Bladder Cancer in her brother and brother; CAD in her sister; Congestive Heart Failure in her father; Heart Problems in her sister; Heart attack in her mother. ROS:   Please see the history of present illness.    All other systems reviewed and are negative.  EKGs/Labs/Other Studies Reviewed:    The following studies were reviewed today:  EKG:  EKG ordered today.  The ekg ordered today demonstrates ***  Recent Labs: 05/23/2017: NT-Pro BNP 748  Recent Lipid Panel No results found for: CHOL, TRIG, HDL, CHOLHDL, VLDL,  LDLCALC, LDLDIRECT  Physical Exam:    VS:  There were no vitals taken for this visit.    Wt Readings from Last 3 Encounters:  06/13/17 180 lb (81.6 kg)  05/23/17 176 lb 12.8 oz (80.2 kg)     GEN: *** Well nourished, well developed in no acute distress HEENT: Normal NECK: No JVD; No carotid bruits LYMPHATICS: No lymphadenopathy CARDIAC: ***RRR, no murmurs, rubs, gallops RESPIRATORY:  Clear to auscultation without rales, wheezing or rhonchi  ABDOMEN: Soft, non-tender, non-distended MUSCULOSKELETAL:  No edema; No deformity  SKIN: Warm and dry NEUROLOGIC:  Alert and oriented x 3 PSYCHIATRIC:  Normal affect    Signed, Diana More, MD  07/19/2017 8:08 AM    [place]

## 2017-07-20 ENCOUNTER — Ambulatory Visit: Payer: Self-pay | Admitting: Cardiology

## 2017-07-23 DIAGNOSIS — Z7901 Long term (current) use of anticoagulants: Secondary | ICD-10-CM | POA: Diagnosis not present

## 2017-08-20 DIAGNOSIS — Z7901 Long term (current) use of anticoagulants: Secondary | ICD-10-CM | POA: Diagnosis not present

## 2017-09-13 DIAGNOSIS — I10 Essential (primary) hypertension: Secondary | ICD-10-CM | POA: Diagnosis not present

## 2017-09-17 DIAGNOSIS — Z7901 Long term (current) use of anticoagulants: Secondary | ICD-10-CM | POA: Diagnosis not present

## 2017-09-21 DIAGNOSIS — Z1231 Encounter for screening mammogram for malignant neoplasm of breast: Secondary | ICD-10-CM | POA: Diagnosis not present

## 2017-10-12 DIAGNOSIS — Z961 Presence of intraocular lens: Secondary | ICD-10-CM | POA: Diagnosis not present

## 2017-10-15 DIAGNOSIS — Z7901 Long term (current) use of anticoagulants: Secondary | ICD-10-CM | POA: Diagnosis not present

## 2017-10-24 ENCOUNTER — Inpatient Hospital Stay (HOSPITAL_COMMUNITY): Payer: Medicare Other

## 2017-10-24 ENCOUNTER — Emergency Department (HOSPITAL_COMMUNITY): Payer: Medicare Other

## 2017-10-24 ENCOUNTER — Encounter (HOSPITAL_COMMUNITY): Payer: Self-pay | Admitting: Internal Medicine

## 2017-10-24 ENCOUNTER — Observation Stay (HOSPITAL_COMMUNITY)
Admission: EM | Admit: 2017-10-24 | Discharge: 2017-10-26 | Disposition: A | Discharge status: Home / Self Care | Payer: Medicare Other | Attending: Student in an Organized Health Care Education/Training Program | Admitting: Student in an Organized Health Care Education/Training Program

## 2017-10-24 DIAGNOSIS — Z79891 Long term (current) use of opiate analgesic: Secondary | ICD-10-CM | POA: Diagnosis not present

## 2017-10-24 DIAGNOSIS — I10 Essential (primary) hypertension: Secondary | ICD-10-CM | POA: Diagnosis not present

## 2017-10-24 DIAGNOSIS — R4781 Slurred speech: Secondary | ICD-10-CM | POA: Diagnosis not present

## 2017-10-24 DIAGNOSIS — G20 Parkinson's disease: Secondary | ICD-10-CM | POA: Insufficient documentation

## 2017-10-24 DIAGNOSIS — R0602 Shortness of breath: Secondary | ICD-10-CM | POA: Diagnosis not present

## 2017-10-24 DIAGNOSIS — M79603 Pain in arm, unspecified: Secondary | ICD-10-CM | POA: Diagnosis not present

## 2017-10-24 DIAGNOSIS — I351 Nonrheumatic aortic (valve) insufficiency: Secondary | ICD-10-CM

## 2017-10-24 DIAGNOSIS — M11231 Other chondrocalcinosis, right wrist: Secondary | ICD-10-CM | POA: Insufficient documentation

## 2017-10-24 DIAGNOSIS — R531 Weakness: Secondary | ICD-10-CM

## 2017-10-24 DIAGNOSIS — Z79899 Other long term (current) drug therapy: Secondary | ICD-10-CM | POA: Diagnosis not present

## 2017-10-24 DIAGNOSIS — Z7901 Long term (current) use of anticoagulants: Secondary | ICD-10-CM | POA: Diagnosis not present

## 2017-10-24 DIAGNOSIS — G2581 Restless legs syndrome: Secondary | ICD-10-CM

## 2017-10-24 DIAGNOSIS — E8841 MELAS syndrome: Secondary | ICD-10-CM | POA: Diagnosis not present

## 2017-10-24 DIAGNOSIS — R29818 Other symptoms and signs involving the nervous system: Secondary | ICD-10-CM | POA: Diagnosis present

## 2017-10-24 DIAGNOSIS — R51 Headache: Secondary | ICD-10-CM | POA: Diagnosis not present

## 2017-10-24 DIAGNOSIS — M109 Gout, unspecified: Secondary | ICD-10-CM | POA: Diagnosis present

## 2017-10-24 DIAGNOSIS — R52 Pain, unspecified: Secondary | ICD-10-CM | POA: Diagnosis not present

## 2017-10-24 DIAGNOSIS — R29810 Facial weakness: Secondary | ICD-10-CM | POA: Diagnosis not present

## 2017-10-24 DIAGNOSIS — M7989 Other specified soft tissue disorders: Secondary | ICD-10-CM | POA: Diagnosis not present

## 2017-10-24 DIAGNOSIS — I160 Hypertensive urgency: Secondary | ICD-10-CM

## 2017-10-24 DIAGNOSIS — R0600 Dyspnea, unspecified: Secondary | ICD-10-CM | POA: Diagnosis not present

## 2017-10-24 DIAGNOSIS — I639 Cerebral infarction, unspecified: Secondary | ICD-10-CM | POA: Diagnosis not present

## 2017-10-24 DIAGNOSIS — R4789 Other speech disturbances: Secondary | ICD-10-CM | POA: Diagnosis not present

## 2017-10-24 LAB — I-STAT CHEM 8, ED
BUN: 14 mg/dL (ref 8–23)
CHLORIDE: 107 mmol/L (ref 98–111)
Calcium, Ion: 1.09 mmol/L — ABNORMAL LOW (ref 1.15–1.40)
Creatinine, Ser: 0.9 mg/dL (ref 0.44–1.00)
GLUCOSE: 86 mg/dL (ref 70–99)
HCT: 30 % — ABNORMAL LOW (ref 36.0–46.0)
Hemoglobin: 10.2 g/dL — ABNORMAL LOW (ref 12.0–15.0)
Potassium: 3.8 mmol/L (ref 3.5–5.1)
SODIUM: 140 mmol/L (ref 135–145)
TCO2: 22 mmol/L (ref 22–32)

## 2017-10-24 LAB — COMPREHENSIVE METABOLIC PANEL
ALBUMIN: 3.5 g/dL (ref 3.5–5.0)
ALK PHOS: 46 U/L (ref 38–126)
ALT: 16 U/L (ref 0–44)
ANION GAP: 7 (ref 5–15)
AST: 20 U/L (ref 15–41)
BILIRUBIN TOTAL: 1 mg/dL (ref 0.3–1.2)
BUN: 13 mg/dL (ref 8–23)
CALCIUM: 8.9 mg/dL (ref 8.9–10.3)
CO2: 23 mmol/L (ref 22–32)
Chloride: 110 mmol/L (ref 98–111)
Creatinine, Ser: 0.94 mg/dL (ref 0.44–1.00)
GFR, EST NON AFRICAN AMERICAN: 56 mL/min — AB (ref >60)
GLUCOSE: 90 mg/dL (ref 70–99)
POTASSIUM: 3.9 mmol/L (ref 3.5–5.1)
Sodium: 140 mmol/L (ref 135–145)
TOTAL PROTEIN: 6.9 g/dL (ref 6.5–8.1)

## 2017-10-24 LAB — DIFFERENTIAL
Abs Immature Granulocytes: 0 10*3/uL (ref 0.0–0.1)
Basophils Absolute: 0.1 10*3/uL (ref 0.0–0.1)
Basophils Relative: 1 %
EOS ABS: 0.2 10*3/uL (ref 0.0–0.7)
EOS PCT: 2 %
IMMATURE GRANULOCYTES: 0 %
LYMPHS ABS: 1.2 10*3/uL (ref 0.7–4.0)
LYMPHS PCT: 13 %
MONO ABS: 0.7 10*3/uL (ref 0.1–1.0)
MONOS PCT: 7 %
Neutro Abs: 6.9 10*3/uL (ref 1.7–7.7)
Neutrophils Relative %: 77 %

## 2017-10-24 LAB — CBG MONITORING, ED
GLUCOSE-CAPILLARY: 84 mg/dL (ref 70–99)
Glucose-Capillary: 83 mg/dL (ref 70–99)

## 2017-10-24 LAB — PROTIME-INR
INR: 2.11
PROTHROMBIN TIME: 23.5 s — AB (ref 11.4–15.2)

## 2017-10-24 LAB — CBC
HEMATOCRIT: 32.9 % — AB (ref 36.0–46.0)
Hemoglobin: 10.6 g/dL — ABNORMAL LOW (ref 12.0–15.0)
MCH: 29 pg (ref 26.0–34.0)
MCHC: 32.2 g/dL (ref 30.0–36.0)
MCV: 89.9 fL (ref 78.0–100.0)
PLATELETS: 223 10*3/uL (ref 150–400)
RBC: 3.66 MIL/uL — ABNORMAL LOW (ref 3.87–5.11)
RDW: 14.6 % (ref 11.5–15.5)
WBC: 9 10*3/uL (ref 4.0–10.5)

## 2017-10-24 LAB — I-STAT TROPONIN, ED: Troponin i, poc: 0.01 ng/mL (ref 0.00–0.08)

## 2017-10-24 LAB — APTT: aPTT: 42 seconds — ABNORMAL HIGH (ref 24–36)

## 2017-10-24 MED ORDER — PANTOPRAZOLE SODIUM 40 MG PO TBEC
40.0000 mg | DELAYED_RELEASE_TABLET | Freq: Every day | ORAL | Status: DC
  Administered 2017-10-25 – 2017-10-26 (×2): 40 mg via ORAL
  Filled 2017-10-24 (×2): qty 1

## 2017-10-24 MED ORDER — ACETAMINOPHEN 325 MG PO TABS
650.0000 mg | ORAL_TABLET | Freq: Four times a day (QID) | ORAL | Status: DC | PRN
  Administered 2017-10-24: 650 mg via ORAL
  Filled 2017-10-24: qty 2

## 2017-10-24 MED ORDER — HYDRALAZINE HCL 20 MG/ML IJ SOLN
10.0000 mg | INTRAMUSCULAR | Status: DC | PRN
  Administered 2017-10-24: 10 mg via INTRAVENOUS
  Filled 2017-10-24: qty 1

## 2017-10-24 MED ORDER — HYDRALAZINE HCL 20 MG/ML IJ SOLN
5.0000 mg | Freq: Once | INTRAMUSCULAR | Status: AC
  Administered 2017-10-24: 5 mg via INTRAVENOUS
  Filled 2017-10-24: qty 1

## 2017-10-24 MED ORDER — CITALOPRAM HYDROBROMIDE 10 MG PO TABS
40.0000 mg | ORAL_TABLET | Freq: Every day | ORAL | Status: DC
  Administered 2017-10-25 – 2017-10-26 (×2): 40 mg via ORAL
  Filled 2017-10-24 (×2): qty 4

## 2017-10-24 MED ORDER — HYDRALAZINE HCL 20 MG/ML IJ SOLN
10.0000 mg | Freq: Once | INTRAMUSCULAR | Status: AC
  Administered 2017-10-24: 10 mg via INTRAVENOUS
  Filled 2017-10-24: qty 1

## 2017-10-24 MED ORDER — PRAMIPEXOLE DIHYDROCHLORIDE 1.5 MG PO TABS: 1.5000 mg | ORAL_TABLET | Freq: Two times a day (BID) | ORAL | Status: DC

## 2017-10-24 MED ORDER — FLUTICASONE PROPIONATE 50 MCG/ACT NA SUSP
1.0000 | Freq: Two times a day (BID) | NASAL | Status: DC
  Administered 2017-10-25 – 2017-10-26 (×3): 1 via NASAL
  Filled 2017-10-24: qty 16

## 2017-10-24 MED ORDER — ENOXAPARIN SODIUM 40 MG/0.4ML ~~LOC~~ SOLN: 40.0000 mg | SUBCUTANEOUS | Status: DC

## 2017-10-24 MED ORDER — PRAMIPEXOLE DIHYDROCHLORIDE 1.5 MG PO TABS
1.5000 mg | ORAL_TABLET | Freq: Two times a day (BID) | ORAL | Status: DC
  Administered 2017-10-25 (×2): 1.5 mg via ORAL
  Filled 2017-10-24 (×2): qty 1

## 2017-10-24 NOTE — Progress Notes (Signed)
Pt arrived on unit at 2140, A/O x4. Q2H checks started at this pont, since they were not able to be completed while pt was in MRI. Diana Simmons

## 2017-10-24 NOTE — ED Notes (Addendum)
Pt c/o right wrist pain and swelling. Right wrist appears to be swollen and slightly red. Dr. Roderic Palau made aware. This RN applied ice and elevated the affected extremity. 10/10 pain reported by patient.

## 2017-10-24 NOTE — Progress Notes (Signed)
Paged by nurse due to concern for new onset visual changes. As per admitting team, visual changes were present on admission and as such do not represent a deviation or acute event. Stated that the patient should be transported to MRI without delay. It should be noted however, that the nursing note signed on 1944 on 08/21 incorrectly states that MD was agreeable to the patient going to MRI w/o a RN due to staffing issues. This was not relayed to the RN by this MD, who spoke with her following the page nor another Internal Medicine MD. Please clarify this in the charting. Patient should be accompanied as per standard stroke protocol.   Kathi Ludwig, MD

## 2017-10-24 NOTE — ED Notes (Signed)
Patient transported to MRI 

## 2017-10-24 NOTE — Consult Note (Addendum)
Referring Physician: Dr. Sherry Ruffing    Chief Complaint: RUE weakness and pain  HPI: Diana Simmons is an 77 y.o. female who noticed RUE pain from her fingertips to her elbow on awakening at 0730. She then noticed RUE weakness, especially involving the hand. She went to see her dentist for a scheduled appointment, and was told to go see a doctor for the arm deficit. Her PCP then sent her to the ED for evaluation via EMS. LKN was 0600 when she had gotten up to go to the bathroom and felt normal. EMS noted the patient to have RLE weakness as well as RUE weakness. She also had dysarthric speech.   She has a prior history of stroke and is on Coumadin. She is allergic to ASA and Plavix. She has a history of CEA. Also with a history of Parkinson's disease.   Stroke risk factors include prior CVA, CAD, HTN.   LSN: [id number] tPA Given: No: On Coumadin NIHSS: 4  Past Medical History:  Diagnosis Date  . Cerebrovascular accident (CVA) ([place]) 02/15/2016  . Chronic anticoagulation 11/22/2014  . Coronary artery disease   . Coronary artery disease involving native coronary artery of native heart with angina pectoris ([place]) 11/22/2014   Overview:  Diffuse disease not amenable to PCI  . Falls, sequela 02/15/2016  . H/O carotid endarterectomy 02/15/2016  . Hypertension   . Lack of coordination 09/24/2012  . Parkinson's disease ([place]) 09/24/2012  . Stroke ([place])   . Tremor 02/15/2016    Past Surgical History:  Procedure Laterality Date  . ABDOMINAL AORTIC ANEURYSM REPAIR    . ABDOMINAL HYSTERECTOMY    . CARDIAC SURGERY    . CAROTID ENDARTERECTOMY    . CHOLECYSTECTOMY    . HERNIA REPAIR      Family History  Problem Relation Age of Onset  . Heart attack Mother   . Congestive Heart Failure Father   . Heart Problems Sister   . CAD Sister   . Bladder Cancer Brother   . Bladder Cancer Brother   . Alzheimer's disease Sister    Social History:  reports that she quit smoking about 29 years ago. She has never  used smokeless tobacco. She reports that she does not drink alcohol or use drugs.  Allergies:  Allergies  Allergen Reactions  . Amlodipine   . Aspirin-Dipyridamole Er     Aggrenox  . Atorvastatin   . Benadryl  [Diphenhydramine]   . Clopidogrel   . Dipyridamole   . Ivp Dye [Iodinated Diagnostic Agents]   . Lovastatin   . Simvastatin   . Sulfa Antibiotics   . Rasagiline Nausea Only    Home Medications:   ROS: As per HPI. Other ROS deferred due to acuity of presentation.   Physical Examination: Weight 85.8 kg.  HEENT: [place]/AT Lungs: Respirations unlabored Ext: No edema  Neurologic Examination: Mental Status: Alert, fully oriented, anxious affect.  Speech fluent without errors of grammar or syntax. Naming intact. Comprehension intact. No dysarthria. At times with hypophonia and slow, laborious speech which resolve when distracted and are suggestive of possible embellishment. Repetition intact. Cranial Nerves: II:  Visual fields intact bilaterally; no extinction to DSS. Right pupil 4 mm and reactive, left pupil 3 mm and reactive.  III,IV, VI: EOMI without nystagmus. No ptosis.  V,VII: Smile symmetric, facial temp sensation equal bilaterally VIII: hearing intact to voice IX,X: Palate rises symmetrically XI: Symmetric XII: midline tongue extension  Motor: RUE: Labored affect when testing RUE motor function. Maximum elicitable  strength is 2/5 grip, 4-/5 biceps, 4-/5 triceps, deltoid 3/5 RLE: 4+/5 proximal and distal with labored affect during testing LUE and LLE: Poor effort with labored affect during testing. Max strength elicited is 4+/5 No cogwheel rigidity noted.  Sensory: Decreased temp and FT RUE. Slight decreased sensation RLE. No extinction upper or lower extremities.  Deep Tendon Reflexes:  3+ bilateral brachioradialis and biceps 4+ patellae 4+ left achilles, 2+ right achilles Chronically upgoing toes (cortical toes) Cerebellar: Slow and labored FNF on right without  ataxia. Left FNF also slow without ataxia. No resting or action tremor noted.  Gait: Deferred  Results for orders placed or performed during the hospital encounter of 10/24/17 (from the past 48 hour(s))  CBG monitoring, ED     Status: None   Collection Time: 10/24/17 11:27 AM  Result Value Ref Range   Glucose-Capillary 83 70 - 99 mg/dL   No results found.  Assessment: 77 y.o. female with acute onset of RUE pain from her fingertips to her elbow on awakening at 0730. She then noticed RUE weakness, especially involving the hand. EMS noted dysarthria as well as RLE weakness. 1. Exam reveals RUE weakness which is equivocal given poor effort and labored affect which would be atypical for nonpsychogenic weakness. Also with intermittent slow, labored, hypophonic speech with pained affect, suggestive of possible embellishment. Cannot rule in or rule out stroke without MRI.  2. CT head without acute abnormality.  3. Has chronic RLE weakness secondary to prior stroke 4. Stroke Risk Factors - prior CVA, CAD, HTN, carotid atherosclerosis.  5. Parkinson's disease, on rasagiline as outpatient. 6. Allergic to ASA and Plavix. On Coumadin as outpatient for stroke prevention.  7. Not a tPA candidate due to anticoagulation on Coumadin with therapeutic INR. Not an endovascular candidate due to overall exam findings not being consistent with LVO.   Plan: 1. HgbA1c, fasting lipid panel 2. MRI, MRA of the brain without contrast 3. PT consult, OT consult, Speech consult 4. Echocardiogram 5. Carotid dopplers 6. Prophylactic therapy- Continue Coumadin 7. Risk factor modification 8. Telemetry monitoring 9. Frequent neuro checks 10. Modified permissive HTN protocol given advanced age, with SBP goal of < 180.  @Electronically  signed: Dr. Kerney Elbe 10/24/2017, 11:35 AM

## 2017-10-24 NOTE — ED Notes (Addendum)
This RN was called by the patient's family over to the bedside d/t patient slurred/delayed speech and visual changes. Pt stated that vision is now blurry and that it was not blurry minutes before. Internal medicine and neuro MD Lindzen made aware. Internal medicine stated that the patient may go to the MRI. Charge RN stated that d/t staffing we will not be able to have a nurse go with the patient to MRI. Internal medicine stated they were okay with this.

## 2017-10-24 NOTE — ED Provider Notes (Signed)
[facility] EMERGENCY DEPARTMENT Provider Note   CSN: [id number] Arrival date & time: 10/24/17  1124   An emergency department physician performed an initial assessment on this suspected stroke patient at 1127.  History   Chief Complaint Chief Complaint  Patient presents with  . Code Stroke    HPI Diana Simmons is a 77 y.o. female.  The history is provided by the patient and medical records. No language interpreter was used.  Neurologic Problem  This is a new problem. The current episode started 3 to 5 hours ago. The problem occurs constantly. The problem has been gradually improving. Associated symptoms include headaches (developed one here). Pertinent negatives include no chest pain, no abdominal pain and no shortness of breath. Nothing aggravates the symptoms. Nothing relieves the symptoms. She has tried nothing for the symptoms. The treatment provided no relief.    Past Medical History:  Diagnosis Date  . Cerebrovascular accident (CVA) ([place]) 02/15/2016  . Chronic anticoagulation 11/22/2014  . Coronary artery disease   . Coronary artery disease involving native coronary artery of native heart with angina pectoris ([place]) 11/22/2014   Overview:  Diffuse disease not amenable to PCI  . Falls, sequela 02/15/2016  . H/O carotid endarterectomy 02/15/2016  . Hypertension   . Lack of coordination 09/24/2012  . Parkinson's disease ([place]) 09/24/2012  . Stroke ([place])   . Tremor 02/15/2016    Patient Active Problem List   Diagnosis Date Noted  . Hyperlipemia 05/23/2017  . Hypertensive heart disease 05/21/2017  . Cerebrovascular accident (CVA) ([place]) 02/15/2016  . Falls, sequela 02/15/2016  . H/O carotid endarterectomy 02/15/2016  . Tremor 02/15/2016  . Chronic anticoagulation 11/22/2014  . Coronary artery disease involving native coronary artery of native heart with angina pectoris ([place]) 11/22/2014  . Lack of coordination 09/24/2012  . Parkinson's disease ([place])  09/24/2012    Past Surgical History:  Procedure Laterality Date  . ABDOMINAL AORTIC ANEURYSM REPAIR    . ABDOMINAL HYSTERECTOMY    . CARDIAC SURGERY    . CAROTID ENDARTERECTOMY    . CHOLECYSTECTOMY    . HERNIA REPAIR       OB History   None      Home Medications    Prior to Admission medications   Medication Sig Start Date End Date Taking? Authorizing Provider  Acetaminophen (TYLENOL ARTHRITIS PAIN PO) Take 2 tablets by mouth 2 (two) times daily.    [provider]  atorvastatin (LIPITOR) 40 MG tablet Take 40 mg by mouth daily. 05/15/17   [provider]  carvedilol (COREG) 3.125 MG tablet Take 3.125 mg by mouth 2 (two) times daily.    [provider]  citalopram (CELEXA) 40 MG tablet Take 40 mg by mouth daily.    [provider]  fluticasone (FLONASE) 50 MCG/ACT nasal spray Place 1 spray into both nostrils 2 (two) times daily.    [provider]  omeprazole (PRILOSEC) 20 MG capsule Take 20 mg by mouth daily.    [provider]  potassium chloride SA (K-DUR,KLOR-CON) 20 MEQ tablet Take 20 mEq by mouth daily.    [provider]  pramipexole (MIRAPEX) 1 MG tablet Take 1 mg by mouth 2 (two) times daily.    [provider]  spironolactone (ALDACTONE) 25 MG tablet Take 0.5 tablets (12.5 mg total) by mouth daily. 06/13/17 09/11/17  Richardo Priest, MD  traMADol (ULTRAM) 50 MG tablet Take 50 mg by mouth every 6 (six) hours as needed.  [provider]  Vitamin D, Ergocalciferol, (DRISDOL) 50000 units CAPS capsule Take 50,000 Units by mouth every 14 (fourteen) days.    [provider]  warfarin (COUMADIN) 5 MG tablet Take 5 mg by mouth as directed. Currently taking 6.5 mg everyday except 5 mg on Mon, Wed, and Fri 03/29/17   [provider]    Family History Family History  Problem Relation Age of Onset  . Heart attack Mother   . Congestive Heart Failure Father   . Heart Problems Sister     . CAD Sister   . Bladder Cancer Brother   . Bladder Cancer Brother   . Alzheimer's disease Sister     Social History Social History   Tobacco Use  . Smoking status: Former Smoker    Last attempt to quit: 1990    Years since quitting: 29.6  . Smokeless tobacco: Never Used  Substance Use Topics  . Alcohol use: No    Frequency: Never  . Drug use: No     Allergies   Amlodipine; Aspirin-dipyridamole er; Atorvastatin; Benadryl  [diphenhydramine]; Clopidogrel; Dipyridamole; Ivp dye [iodinated diagnostic agents]; Lovastatin; Simvastatin; Sulfa antibiotics; and Rasagiline   Review of Systems Review of Systems  Constitutional: Positive for fatigue. Negative for chills and fever.  HENT: Negative for congestion.   Eyes: Negative for visual disturbance.  Respiratory: Negative for chest tightness, shortness of breath, wheezing and stridor.   Cardiovascular: Negative for chest pain and palpitations.  Gastrointestinal: Negative for abdominal pain, constipation, diarrhea, nausea and vomiting.  Genitourinary: Negative for dysuria and frequency.  Musculoskeletal: Negative for back pain, neck pain and neck stiffness.  Skin: Negative for rash and wound.  Neurological: Positive for speech difficulty, weakness, numbness and headaches (developed one here). Negative for dizziness and light-headedness.  Psychiatric/Behavioral: Negative for agitation.  All other systems reviewed and are negative.    Physical Exam Updated Vital Signs Wt 85.8 kg   SpO2 98%   BMI 35.74 kg/m   Physical Exam  Constitutional: She is oriented to person, place, and time. She appears well-developed and well-nourished. No distress.  HENT:  Head: Atraumatic.  Mouth/Throat: Oropharynx is clear and moist. No oropharyngeal exudate.  Eyes: Pupils are equal, round, and reactive to light. Conjunctivae and EOM are normal.  Neck: Normal range of motion. Neck supple.  Cardiovascular: Normal rate and regular rhythm.  No  murmur heard. Pulmonary/Chest: Effort normal and breath sounds normal. No respiratory distress. She has no wheezes. She has no rales. She exhibits no tenderness.  Abdominal: Soft. There is no tenderness.  Musculoskeletal: She exhibits no edema.  Neurological: She is alert and oriented to person, place, and time. She is not disoriented. She displays no tremor. A cranial nerve deficit and sensory deficit is present. She exhibits abnormal muscle tone. Coordination normal.  Patient has subtle right-sided facial droop.  Also weakness in right arm and right leg compared to left.  Patient reports numbness in right face, right arm, and right leg.  Speech was slow but clear on my exam.  Skin: Skin is warm and dry. Capillary refill takes less than 2 seconds. No rash noted. She is not diaphoretic. No erythema.  Psychiatric: She has a normal mood and affect.  Nursing note and vitals reviewed.    ED Treatments / Results  Labs (all labs ordered are listed, but only abnormal results are displayed) Labs Reviewed  PROTIME-INR - Abnormal; Notable for the following components:      Result Value   Prothrombin Time  23.5 (*)    All other components within normal limits  APTT - Abnormal; Notable for the following components:   aPTT 42 (*)    All other components within normal limits  CBC - Abnormal; Notable for the following components:   RBC 3.66 (*)    Hemoglobin 10.6 (*)    HCT 32.9 (*)    All other components within normal limits  COMPREHENSIVE METABOLIC PANEL - Abnormal; Notable for the following components:   GFR calc non Af Amer 56 (*)    All other components within normal limits  I-STAT CHEM 8, ED - Abnormal; Notable for the following components:   Calcium, Ion 1.09 (*)    Hemoglobin 10.2 (*)    HCT 30.0 (*)    All other components within normal limits  DIFFERENTIAL  CBG MONITORING, ED  I-STAT TROPONIN, ED  CBG MONITORING, ED    EKG EKG Interpretation  Date/Time:  Wednesday October 24 2017 11:46:57 EDT Ventricular Rate:  62 PR Interval:    QRS Duration: 99 QT Interval:  440 QTC Calculation: 447 R Axis:   60 Text Interpretation:  Sinus rhythm When compared to prior, no significant changes seen.  No STEMI Confirmed by Antony Blackbird [phone]) on 10/24/2017 1:13:35 PM   Radiology Ct Head Code Stroke Wo Contrast  Result Date: 10/24/2017 CLINICAL DATA:  Code stroke. Focal neuro deficit, less than 6 hours, stroke suspected. The patient awoke 4 hours ago with right lower extremity weakness and abnormal speech EXAM: CT HEAD WITHOUT CONTRAST TECHNIQUE: Contiguous axial images were obtained from the base of the skull through the vertex without intravenous contrast. COMPARISON:  CT head without contrast 01/27/2017 FINDINGS: Brain: Mild atrophy and white matter disease is similar to the prior exam. No acute infarct, hemorrhage, or mass lesion is present. The ventricles are of normal size. Basal ganglia are intact. The insular ribbon is normal. No acute or focal cortical abnormalities are present or new. The brainstem and cerebellum is normal. Hypoattenuation in the right frontal lobe on image 18 is artifactual. Vascular: Atherosclerotic calcifications are present within the cavernous internal carotid arteries and at the dural margin of the left vertebral artery. There is no hyperdense vessel. Skull: Calvarium is intact. Degenerative changes are present at C1-2. No focal lytic or blastic lesions are present. No significant extracranial soft tissue lesions are present. Sinuses/Orbits: Mild mucosal thickening is present within the ethmoid air cells and inferior maxillary sinuses. Paranasal sinuses and mastoid air cells are otherwise clear. No obstructing lesions are present. ASPECTS (Cromberg Stroke Program Early CT Score) - Ganglionic level infarction (caudate, lentiform nuclei, internal capsule, insula, M1-M3 cortex): 7/7 - Supraganglionic infarction (M4-M6 cortex): 3/3 Total score (0-10 with 10  being normal): 10/10 IMPRESSION: 1. Stable atrophy and white matter disease, likely reflecting the sequela of chronic microvascular ischemia. 2. No acute intracranial abnormality. 3. Minimal sinus disease. 4. Atherosclerosis. 5. ASPECTS is 10/10 The above was relayed via text pager to Dr. Cheral Marker on 10/24/2017 at 11:44 . Electronically Signed   By: San Morelle M.D.   On: 10/24/2017 11:45    Procedures Procedures (including critical care time)  Medications Ordered in ED Medications  hydrALAZINE (APRESOLINE) injection 10 mg (has no administration in time range)  hydrALAZINE (APRESOLINE) injection 5 mg (5 mg Intravenous Given 10/24/17 1339)     Initial Impression / Assessment and Plan / ED Course  I have reviewed the triage vital signs and the nursing notes.  Pertinent labs & imaging results that were  available during my care of the patient were reviewed by me and considered in my medical decision making (see chart for details).     Diana Simmons is a 77 y.o. female with a past medical history significant for prior stroke, prior carotid endarterectomy, CAD, hypertension, and Parkinson's disease who presents as a code stroke with right sided numbness and weakness.  Patient reports her prior stroke left her with some weakness on the left side but today started having right arm and right leg weakness.  She reports that at 7:30 AM she noticed pain and weakness in her right arm.  She also thought she had right facial droop.  She reports her speech sounded different.  Patient is on Coumadin for prior stroke as she is allergic to aspirin and Plavix.  History of carotid endarterectomy as well.  Patient went directly to the CT scanner and then was seen by neurology.  On my exam, patient had subtle weakness in her right arm and right leg as well as mild right facial droop.  Reported decreased sensation in the right face, right arm, and right leg.  Lungs clear and chest nontender.  Abdomen  nontender.  Patient is alert and oriented.  Patient has slow speech but was clear for me.  Neurology requesting she be admitted to medicine service for MRI and MRA as well as further work-up for possible TIA versus stroke.  Medicine team will be called for admission.  3:47 PM Neurology I did their note to request that the patient's systolic blood pressure goal be a maximum of 180.  This was also relayed to the admitting team.   Final Clinical Impressions(s) / ED Diagnoses   Final diagnoses:  Shortness of breath  Right sided weakness     Clinical Impression: 1. Right sided weakness   2. Shortness of breath     Disposition: Admit  This note was prepared with assistance of Dragon voice recognition software. Occasional wrong-word or sound-a-like substitutions may have occurred due to the inherent limitations of voice recognition software.     Nylene Inlow, Gwenyth Allegra, MD 10/24/17 1550

## 2017-10-24 NOTE — H&P (Signed)
Date: 10/24/2017               Patient Name:  Diana Simmons MRN: [id number]  DOB: [birth year] Age / Sex: 77 y.o., female   PCP: Diana Roger, MD         Medical Service: Internal Medicine Teaching Service         Attending Physician: Dr. Evette Doffing, Mallie Mussel, *    First Contact: Dr. Donne Hazel Pager: [phone]  Second Contact: Dr. Donne Hazel Pager: [phone]       After Hours (After 5p/  First Contact Pager: [phone]  weekends / holidays): Second Contact Pager: [phone]   Chief Complaint: right arm pain   History of Present Illness:  Diana Simmons is a 37 yoF with Parkinson disease, HTN, OSA (not on CPAP) and a h/o TIAs in early 2000s (residual left arm and leg weakness now on coumadin), MI, and carotid endarterectomy who presents with right arm pain and weakness. She was in her usual state of health until this morning around 7:30am. She woke up at 6am to use the bathroom and then went back to sleep. At 7:30am, she woke up and noted right arm pain from her fingertips to her elbow and associated weakness. She went to her scheduled dentist appointment and was told to go see a doctor for her arm pain/weakenss. She went to her PCP who sent her to the ED. In addition to the RUE pain and weakness, she also notes right sided headache, blurry vision, watery eyes, right sided leg weakness, and slowed speech. She is also hypertensive and has a headache. She states that since Sept 2018 she has had difficulties controlling her BP, it is very labile.   Over the last 3-4 months, she endorses increased swelling in her legs, arms, and abdomen with associated orthopnea, sob, and dry cough. Over the last 10 days, she notes a 10lb weight gain and today feels her body is "heavy."   ROS negative for chest pain, n/v/d, fevers, chills     Meds:  Current Meds  Medication Sig  . Acetaminophen (TYLENOL ARTHRITIS PAIN PO) Take 2 tablets by mouth 2 (two) times daily.  Marland Kitchen atorvastatin (LIPITOR) 40 MG tablet Take 40 mg by  mouth daily at 6 PM.   . carvedilol (COREG) 3.125 MG tablet Take 3.125 mg by mouth 2 (two) times daily.  . citalopram (CELEXA) 40 MG tablet Take 40 mg by mouth daily.  . fluticasone (FLONASE) 50 MCG/ACT nasal spray Place 1 spray into both nostrils 2 (two) times daily.  Marland Kitchen lisinopril (PRINIVIL,ZESTRIL) 10 MG tablet Take 10 mg by mouth daily at 12 noon.  . nitroGLYCERIN (NITROSTAT) 0.4 MG SL tablet Place 0.4 mg under the tongue every 5 (five) minutes as needed for chest pain.  Marland Kitchen omeprazole (PRILOSEC) 20 MG capsule Take 20 mg by mouth daily.  . potassium chloride SA (K-DUR,KLOR-CON) 20 MEQ tablet Take 20 mEq by mouth daily.  . pramipexole (MIRAPEX) 1.5 MG tablet Take 1.5 mg by mouth 2 (two) times daily.   Marland Kitchen spironolactone (ALDACTONE) 25 MG tablet Take 0.5 tablets (12.5 mg total) by mouth daily.  . traMADol (ULTRAM) 50 MG tablet Take 50 mg by mouth every 6 (six) hours as needed.  . Vitamin D, Ergocalciferol, (DRISDOL) 50000 units CAPS capsule Take 50,000 Units by mouth every 14 (fourteen) days.  Marland Kitchen warfarin (COUMADIN) 1 MG tablet Take 0.5 mg by mouth at bedtime. Take with 6 mg for a total of 6.5 mg  .  warfarin (COUMADIN) 6 MG tablet Take 6 mg by mouth one time only at 6 PM. Take with half of 1 mg tab for a total of 6.5 mg     Allergies: Allergies as of 10/24/2017 - Review Complete 10/24/2017  Allergen Reaction Noted  . Ivp dye [iodinated diagnostic agents] Anaphylaxis 05/21/2017  . Amlodipine  10/13/2013  . Aspirin-dipyridamole er  10/13/2013  . Benadryl [diphenhydramine]  10/13/2013  . Dipyridamole  10/13/2013  . Lovastatin  10/13/2013  . Simvastatin  10/13/2013  . Sulfa antibiotics  10/13/2013  . Clopidogrel Rash 10/13/2013  . Rasagiline Nausea Only 11/06/2013   Past Medical History:  Diagnosis Date  . Cerebrovascular accident (CVA) ([place]) 02/15/2016  . Chronic anticoagulation 11/22/2014  . Coronary artery disease   . Coronary artery disease involving native coronary artery of native  heart with angina pectoris ([place]) 11/22/2014   Overview:  Diffuse disease not amenable to PCI  . Falls, sequela 02/15/2016  . H/O carotid endarterectomy 02/15/2016  . Hypertension   . Lack of coordination 09/24/2012  . Parkinson's disease ([place]) 09/24/2012  . Stroke ([place])   . Tremor 02/15/2016  PMH: 2001 and 2002: TIAs (attributed to vasospasm) 1998: MI  Surgical History: 1996 right carotid endarterectomy  Abdominal aortic aneurysm repair   Family History:  Bladder cancer: brother Heart attack: mom CHF: dad Heart problems, CAD, alzheimer's disease: sister  Social History: lives with husband. Independent. 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 No tobacco or alcohol use.   Review of Systems: A complete ROS was negative except as per HPI.   Physical Exam: Blood pressure (!) 202/67, pulse 61, resp. rate 19, weight 85.8 kg, SpO2 97 %. Vitals:   10/24/17 1315 10/24/17 1415 10/24/17 1530 10/24/17 1545  BP: (!) 205/93 (!) 204/70 (!) 223/80 (!) 202/67  Pulse: (!) 54 (!) 55 60 61  Resp: 20 13 15 19   SpO2: 99% 99% 100% 97%  Weight:       General: Vital signs reviewed.  Patient is well-developed and well-nourished, anxious and ill appearing but in no acute distress and cooperative with exam.  Head: Normocephalic and atraumatic. Eyes: EOMI, conjunctivae normal, no scleral icterus, PERRL, watery discharge and frequent blinking  Neck: Supple, trachea midline, normal ROM, no JVD, masses, thyromegaly, or carotid bruit present.  Cardiovascular: RRR, S1 normal, S2 normal, no murmurs, gallops, or rubs. Pulmonary/Chest: Clear to auscultation bilaterally, no wheezes, rales, or rhonchi. Abdominal: Soft, non-tender, non-distended, BS +, no masses, organomegaly, or guarding present.  Musculoskeletal: right wrist swollen, warm, tender to palpation, with non-blanching red macules. Pain with rotation of the wrist Extremities: No  lower extremity edema bilaterally,  pulses symmetric and intact bilaterally. No cyanosis or clubbing. Neurological: A&O x3, Decreased sensation on the right face, arm and leg. Right sided facial droop with smile. Eyebrow raise is symmetric. Uvula midline. Shoulder shrug and head turn weaker on the right side. Decreased right sided grip strength. 3/5 strength of right arm raise and 3/5 right leg raise. Left sided strength of upper and lower extremity is 5/5. Reflexes symmetric bilaterally.  Psychiatric: Normal mood and affect. Slowed speech. behavior is normal. Cognition and memory are normal.    EKG: personally reviewed my interpretation is sinus rhythm 62 bpm, no ST elevations, no Q waves or T wave inversions   CXR: personally reviewed my interpretation is no pleural effusion, minimal atelectasis at the left lung base laterally. Osteoarthritis of  the included right acromioclavicular joint and both glenohumeral joints  Assessment & Plan by Problem: Active Problems:   Neurological deficit present   Parkinson's disease ([place])   Hypertensive urgency    1. Right sided weakness: Concerning for stroke. CT head negative. Has h/o of TIAs in 2001 and 2002. Has been on coumadin ever since for stroke prevention.  - neuro consulted - PT/OT/Speech  - brain MRI/MRA - carotid ultrasound - echo - telemetry  - risk factor modification: a1c, lipid panel  2. Right wrist pain: red, warm, swollen with petechial rash. Denies trauma. Radial pulse intact. Differential includes DVT, cellulitis, gout, fracture. Most concern for DVT given possible stroke. Less likely fracture due to lack of trauma. No leukocytosis, fever or wound, so less likely cellulitis. Would not expect a petechial rash with gout.  - ultrasound  3. HTN: Reports labile BP since Sept 2018. She is on lisinopril, spironolactone, and coreg at home. Gets headaches when BP is elevated.     - goal SBP < 180 - IV hydralazine 10mg  q4h prn for SBP > 180   - consider secondary causes of HTN  - holding home coreg, spironolactone, and lisinopril   4. Orthopnea, weight gain, dry cough: 10lb weight gain in the last 10 days, increased swelling over 3-4 months, sob, and body "feels heavy." Some doctors have said she had heart failure but then other doctors have said she doesn't have heart failure. Last echo was Sept 2018 EF 57%. Euvolemic on exam. Dry cough could be side effect of lisinopril.  - echo per #1  5. Parkinson Disease: hold home pramipexole 6. CAD: continue home statin  Dispo: Admit patient to Inpatient with expected length of stay greater than 2 midnights.  Signed: Isabelle Course, MD 10/24/2017, 5:33 PM  Pager: [phone]

## 2017-10-24 NOTE — Code Documentation (Signed)
77 year old female presents to [facility] via [facility].  Patient reports she went to bed last night all was well.  She was up to the bathroom at 0600 and all was fine.  She awakened at 0730 with right arm pain and decreased grip.  She drove to the dentist office and had some work done.  Her dentist encouraged her to go to get checked out - she then drove to her primary care office - they reported right side weakness with decreased grips - EMS was called - the code stroke was called in the field and she was transported to [facility].  On arrival she is alert - speech slow but clear - no aphasia - oriented - complains of right arm pain from the finger tips to the shoulder - mild left arm drift.  She was met at the bridge by the stroke team - airway cleared and taken to CT scan. Dr. Cheral Marker at bedside.  Hypertensive 230/105 per EMS.  CT scan done.  Patient is on Coumadin for previous stroke which she has some mild left leg residual.  NIHHS 4 - left arm and leg drift - right leg drift and sensation loss right side.  Out of tPA window - INR pending.  No acute treatment per Dr. Cheral Marker.  Handoff to [employer].

## 2017-10-24 NOTE — Progress Notes (Signed)
  Echocardiogram 2D Echocardiogram has been performed.  Diana Simmons 10/24/2017, 5:16 PM

## 2017-10-24 NOTE — ED Notes (Signed)
Pt bed changed and new purwick placed.

## 2017-10-24 NOTE — ED Triage Notes (Addendum)
Pt here from [facility] office presenting with right arm and leg weakness. Upon EMS arrival patient's speech WDL. En route to hospital speech became increasingly delayed and slow. Pt has hx of left sided stroke. BP for EMS 250/110. Pt had dental work done this morning and was given novocaine.

## 2017-10-25 ENCOUNTER — Encounter (HOSPITAL_COMMUNITY): Payer: Self-pay

## 2017-10-25 ENCOUNTER — Inpatient Hospital Stay (HOSPITAL_BASED_OUTPATIENT_CLINIC_OR_DEPARTMENT_OTHER): Payer: Medicare Other

## 2017-10-25 ENCOUNTER — Inpatient Hospital Stay (HOSPITAL_COMMUNITY): Payer: Medicare Other

## 2017-10-25 ENCOUNTER — Other Ambulatory Visit: Payer: Self-pay

## 2017-10-25 DIAGNOSIS — M25431 Effusion, right wrist: Secondary | ICD-10-CM

## 2017-10-25 DIAGNOSIS — M11231 Other chondrocalcinosis, right wrist: Secondary | ICD-10-CM | POA: Diagnosis not present

## 2017-10-25 DIAGNOSIS — I639 Cerebral infarction, unspecified: Secondary | ICD-10-CM

## 2017-10-25 DIAGNOSIS — Z7901 Long term (current) use of anticoagulants: Secondary | ICD-10-CM

## 2017-10-25 DIAGNOSIS — R29818 Other symptoms and signs involving the nervous system: Secondary | ICD-10-CM | POA: Diagnosis not present

## 2017-10-25 DIAGNOSIS — M109 Gout, unspecified: Secondary | ICD-10-CM | POA: Diagnosis present

## 2017-10-25 DIAGNOSIS — I10 Essential (primary) hypertension: Secondary | ICD-10-CM | POA: Diagnosis not present

## 2017-10-25 DIAGNOSIS — Z886 Allergy status to analgesic agent status: Secondary | ICD-10-CM

## 2017-10-25 DIAGNOSIS — Z8673 Personal history of transient ischemic attack (TIA), and cerebral infarction without residual deficits: Secondary | ICD-10-CM | POA: Diagnosis not present

## 2017-10-25 DIAGNOSIS — Z79899 Other long term (current) drug therapy: Secondary | ICD-10-CM

## 2017-10-25 DIAGNOSIS — Z888 Allergy status to other drugs, medicaments and biological substances status: Secondary | ICD-10-CM

## 2017-10-25 LAB — LIPID PANEL
Cholesterol: 115 mg/dL (ref 0–200)
HDL: 36 mg/dL — ABNORMAL LOW (ref >40)
LDL CALC: 65 mg/dL (ref 0–99)
Total CHOL/HDL Ratio: 3.2 RATIO
Triglycerides: 71 mg/dL (ref <150)
VLDL: 14 mg/dL (ref 0–40)

## 2017-10-25 LAB — BASIC METABOLIC PANEL
Anion gap: 10 (ref 5–15)
BUN: 15 mg/dL (ref 8–23)
CO2: 24 mmol/L (ref 22–32)
Calcium: 8.9 mg/dL (ref 8.9–10.3)
Chloride: 105 mmol/L (ref 98–111)
Creatinine, Ser: 0.93 mg/dL (ref 0.44–1.00)
GFR calc non Af Amer: 57 mL/min — ABNORMAL LOW (ref >60)
Glucose, Bld: 99 mg/dL (ref 70–99)
POTASSIUM: 3.4 mmol/L — AB (ref 3.5–5.1)
SODIUM: 139 mmol/L (ref 135–145)

## 2017-10-25 LAB — ECHOCARDIOGRAM COMPLETE: WEIGHTICAEL: 3026.47 [oz_av]

## 2017-10-25 LAB — CBC
HEMATOCRIT: 32.3 % — AB (ref 36.0–46.0)
HEMOGLOBIN: 10.6 g/dL — AB (ref 12.0–15.0)
MCH: 29.3 pg (ref 26.0–34.0)
MCHC: 32.8 g/dL (ref 30.0–36.0)
MCV: 89.2 fL (ref 78.0–100.0)
Platelets: 212 10*3/uL (ref 150–400)
RBC: 3.62 MIL/uL — AB (ref 3.87–5.11)
RDW: 14.7 % (ref 11.5–15.5)
WBC: 8.9 10*3/uL (ref 4.0–10.5)

## 2017-10-25 LAB — PROTIME-INR
INR: 1.98
PROTHROMBIN TIME: 22.3 s — AB (ref 11.4–15.2)

## 2017-10-25 LAB — C-REACTIVE PROTEIN: CRP: 1.9 mg/dL — AB (ref <1.0)

## 2017-10-25 LAB — SEDIMENTATION RATE: SED RATE: 40 mm/h — AB (ref 0–22)

## 2017-10-25 LAB — MRSA PCR SCREENING: MRSA BY PCR: POSITIVE — AB

## 2017-10-25 LAB — HEMOGLOBIN A1C
HEMOGLOBIN A1C: 5.4 % (ref 4.8–5.6)
Mean Plasma Glucose: 108.28 mg/dL

## 2017-10-25 MED ORDER — PRAMIPEXOLE DIHYDROCHLORIDE 0.125 MG PO TABS: 0.5000 mg | ORAL_TABLET | Freq: Every day | ORAL | Status: DC

## 2017-10-25 MED ORDER — POTASSIUM CHLORIDE CRYS ER 20 MEQ PO TBCR
20.0000 meq | EXTENDED_RELEASE_TABLET | Freq: Two times a day (BID) | ORAL | Status: AC
  Administered 2017-10-25 (×2): 20 meq via ORAL
  Filled 2017-10-25 (×2): qty 1

## 2017-10-25 MED ORDER — WARFARIN - PHARMACIST DOSING INPATIENT: Freq: Every day | Status: DC

## 2017-10-25 MED ORDER — HYDROCODONE-ACETAMINOPHEN 5-325 MG PO TABS
1.0000 | ORAL_TABLET | Freq: Once | ORAL | Status: AC
  Administered 2017-10-25: 1 via ORAL
  Filled 2017-10-25: qty 1

## 2017-10-25 MED ORDER — WARFARIN SODIUM 4 MG PO TABS
8.0000 mg | ORAL_TABLET | Freq: Once | ORAL | Status: AC
  Administered 2017-10-25: 8 mg via ORAL
  Filled 2017-10-25: qty 2

## 2017-10-25 MED ORDER — PRAMIPEXOLE DIHYDROCHLORIDE 1.5 MG PO TABS
1.5000 mg | ORAL_TABLET | Freq: Every day | ORAL | Status: DC
  Administered 2017-10-25: 1.5 mg via ORAL
  Filled 2017-10-25 (×2): qty 1

## 2017-10-25 NOTE — Progress Notes (Signed)
We received a page regarding 10/10 wrist pain, and nurse noted redness of the area may be progressing. We went to evaluate at bedside. She described acute onset, severe throbbing pain in her R wrist that radiates into her hand and up to her elbow that began early yesterday morning. It is worse with any movement. No palliating factors. Denies history of similar symptoms. She received tylenol an hour prior to evaluation without relief.   Physical exam: Gen: Alert&O x3, appears in mild distress secondary to pain MSK: right wrist is warm, swollen, erythematous, exquisitely tender to palpation. Limited ROM due to pain. Most tender at soft tissue overlying radiocarpal joint.  CV: RRR; no murmurs, rubs or gallops Resp: no increased work of breathing; lungs CTA in anterior fields   A/P 1. Right wrist pain - x-ray showed non-specific soft tissue abnormalities; no acute boney abnormality that would explain the pain. Have ordered MRI to further assess if this is a soft tissue inflammation or if it is in the joint itself. If it is in the joint, will need aspiration for fluid analysis; differential for monarthritis includes gout, pseudogout septic joint.  Unfortunately, options for pain medication are limited in the setting of coumadin. Will treat with ice and one time dose of Norco.

## 2017-10-25 NOTE — Progress Notes (Signed)
Preliminary notes--Bilateral carotid duplex exam completed. Bilateral ICAs 40-59% stenosis. Bilateral vertebral arteries patent with antegrade flow.  Hongying Landry Mellow (RDMS RVT) 10/25/17 12:43 PM

## 2017-10-25 NOTE — Evaluation (Signed)
Speech Language Pathology Evaluation Patient Details Name: Diana Simmons MRN: [id number] DOB: [birth year] Today's Date: 10/25/2017 Time: 4196-2229 SLP Time Calculation (min) (ACUTE ONLY): 23 min  Problem List:  Patient Active Problem List   Diagnosis Date Noted  . Neurological deficit present 10/24/2017  . Hypertensive urgency 10/24/2017  . Hyperlipemia 05/23/2017  . Hypertensive heart disease 05/21/2017  . Cerebrovascular accident (CVA) ([place]) 02/15/2016  . Falls, sequela 02/15/2016  . H/O carotid endarterectomy 02/15/2016  . Tremor 02/15/2016  . Chronic anticoagulation 11/22/2014  . Coronary artery disease involving native coronary artery of native heart with angina pectoris ([place]) 11/22/2014  . Lack of coordination 09/24/2012  . Parkinson's disease ([place]) 09/24/2012   Past Medical History:  Past Medical History:  Diagnosis Date  . Cerebrovascular accident (CVA) ([place]) 02/15/2016  . Chronic anticoagulation 11/22/2014  . Coronary artery disease   . Coronary artery disease involving native coronary artery of native heart with angina pectoris ([place]) 11/22/2014   Overview:  Diffuse disease not amenable to PCI  . Falls, sequela 02/15/2016  . H/O carotid endarterectomy 02/15/2016  . Hypertension   . Lack of coordination 09/24/2012  . Parkinson's disease ([place]) 09/24/2012  . Stroke ([place])   . Tremor 02/15/2016   Past Surgical History:  Past Surgical History:  Procedure Laterality Date  . ABDOMINAL AORTIC ANEURYSM REPAIR    . ABDOMINAL HYSTERECTOMY    . CARDIAC SURGERY    . CAROTID ENDARTERECTOMY    . CHOLECYSTECTOMY    . HERNIA REPAIR     HPI:  77 yo female with h/o Parkinsons disease, HTN, OSA (not on CPAP) and a h/o TIAs in early 2000s (residual left arm and leg weakness now on coumadin), MI, and carotid endarterectomy who presented with right arm pain and weakness, blurry vision, watery eyes, right sided leg weakness, and slowed speech. She was also hypertensive with a headache.  Stroke work-up with MRI negative for acute changes.   Assessment / Plan / Recommendation Clinical Impression  Patient's speech and language function appeared [facility] for all tasks. Patient was 100% intelligible without evidence of word-finding deficits throughout a functional conversation and during generative naming tasks. Patient administered portions of the MoCA subtests and scored [facility] for delayed recall, problem solving and attention. Patient's family present and both the patient and family feel she is at her cognitive-linguistic baseline. However, they do report mild changes due to "aging" such as repetition of stories and occasional episodes of losing her train of thought. SLP educated family to consult physician if they feel issues increase or impact her function and safety at home. All verbalized understanding. No SLP f/u warranted at this time.      SLP Assessment  SLP Recommendation/Assessment: Patient does not need any further Speech Lanaguage Pathology Services    Follow Up Recommendations  None    Frequency and Duration N/A       SLP Evaluation Cognition  Overall Cognitive Status: Within Functional Limits for tasks assessed Arousal/Alertness: Awake/alert Orientation Level: Oriented X4 Memory: Appears intact Awareness: Appears intact Problem Solving: Appears intact Safety/Judgment: Appears intact       Comprehension  Auditory Comprehension Overall Auditory Comprehension: Appears within functional limits for tasks assessed Conversation: Simple Visual Recognition/Discrimination Discrimination: Not tested Reading Comprehension Reading Status: Not tested    Expression Expression Primary Mode of Expression: Verbal Verbal Expression Overall Verbal Expression: Appears within functional limits for tasks assessed   Oral / Motor  Oral Motor/Sensory Function Overall Oral Motor/Sensory Function: Within functional limits Motor Speech  Overall Motor Speech: Appears within  functional limits for tasks assessed   [place], [place] 10/25/2017, 11:50 AM   Weston Anna, [place], [place]

## 2017-10-25 NOTE — Progress Notes (Signed)
PT Cancellation Note  Patient Details Name: Diana Simmons MRN: [id number] DOB: November 04, 1940   Cancelled Treatment:    Reason Eval/Treat Not Completed: Patient at procedure or test/unavailable (vascular).  Ellamae Sia, PT, DPT Acute Rehabilitation Services  Pager: [phone]    Willy Eddy 10/25/2017, 11:51 AM

## 2017-10-25 NOTE — Evaluation (Signed)
Occupational Therapy Evaluation Patient Details Name: ARMENTA ERSKIN MRN: [id number] DOB: January 30, 1941 Today's Date: 10/25/2017    History of Present Illness Pt is a 77 y.o. F with significant PMH of Parkinson disease, HTN, OSA (not on CPAP), and h/o TIAs in early 2000s with residual left arm and leg weakness as well as MI. Presenting with right arm pain, weakness, and blurred vision. She now states blurred vision has resolved. CT and MRI negative for acute ischemic events. Xray of right wrist negative for acute fracture.    Clinical Impression   This 77 y/o female presents with the above. At baseline pt is mod independent with ADLs and functional mobility intermittently using SPC. Pt presenting with increased RUE pain, generalized weakness and decreased dynamic mobility. Pt requires minA for functional mobility in room [place]) this session; currently requires minA for UB ADLs, MaxA for LB ADLs. Pt very motivated to work with therapy and progress to PLOF. Will benefit from continued acute OT services and feel she will benefit from CIR level therapy services at time of discharge to maximize her safety and independence with ADLs and mobility. Will follow.    Follow Up Recommendations  CIR;Supervision/Assistance - 24 hour    Equipment Recommendations  3 in 1 bedside commode;Other (comment)(TBD in next venue )           Precautions / Restrictions Precautions Precautions: Fall Precaution Comments: right wrist pain Restrictions Weight Bearing Restrictions: No      Mobility Bed Mobility Overal bed mobility: Needs Assistance Bed Mobility: Supine to Sit;Sit to Supine     Supine to sit: Supervision Sit to supine: Supervision   General bed mobility comments: supervision with HOB elevated, increased time/effort   Transfers Overall transfer level: Needs assistance Equipment used: 1 person hand held assist Transfers: Sit to/from Stand Sit to Stand: Min assist         General transfer  comment: Light min assist to stand with cues for hand placement    Balance Overall balance assessment: Needs assistance Sitting-balance support: No upper extremity supported;Feet supported Sitting balance-Leahy Scale: Good     Standing balance support: Single extremity supported;During functional activity Standing balance-Leahy Scale: Fair                        ADL either performed or assessed with clinical judgement   ADL Overall ADL's : Needs assistance/impaired Eating/Feeding: Independent;Sitting   Grooming: Minimal assistance;Standing;Wash/dry hands Grooming Details (indicate cue type and reason): minA for standing balance  Upper Body Bathing: Minimal assistance;Sitting   Lower Body Bathing: Moderate assistance;Sit to/from stand   Upper Body Dressing : Minimal assistance;Sitting Upper Body Dressing Details (indicate cue type and reason): due to limited RUE ROM at this time  Lower Body Dressing: Maximal assistance;Sit to/from stand Lower Body Dressing Details (indicate cue type and reason): assist to thread LEs into briefs due to RUE pain steadying assist in standing  Toilet Transfer: Minimal assistance;Ambulation;Grab bars;Regular Toilet   Toileting- Clothing Manipulation and Hygiene: Minimal assistance;Sit to/from stand Toileting - Clothing Manipulation Details (indicate cue type and reason): pt performing peri-care using lateral leans; min steadying assist in standing while pt performs clothing management      Functional mobility during ADLs: Minimal assistance       Vision Baseline Vision/History: Wears glasses;Cataracts Wears Glasses: Reading only Patient Visual Report: Other (comment)(pt reports initial diplopia but reports has since subsided ) Additional Comments: pt requires cues to locate soap to pt's right when completing  ADLs at sink; will further assess, did not formally assess this session      Perception     Praxis      Pertinent  Vitals/Pain Pain Assessment: Faces Pain Score: 4  Faces Pain Scale: Hurts even more Pain Location: right wrist red and swollen, decreased functional use; right arm pain with elevation Pain Descriptors / Indicators: Discomfort;Grimacing Pain Intervention(s): Limited activity within patient's tolerance;Monitored during session     Hand Dominance Right   Extremity/Trunk Assessment Upper Extremity Assessment Upper Extremity Assessment: RUE deficits/detail RUE Deficits / Details: decreased grip strength and AROM due to painful UE  RUE: Unable to fully assess due to pain RUE Coordination: decreased fine motor;decreased gross motor LUE Deficits / Details: WFL for tasks assessed    Lower Extremity Assessment Lower Extremity Assessment: Defer to PT evaluation RLE Deficits / Details: 5/5 LLE Deficits / Details: Grossly 4/5 except hip flexion 3+/5       Communication Communication Communication: No difficulties   Cognition Arousal/Alertness: Awake/alert Behavior During Therapy: WFL for tasks assessed/performed Overall Cognitive Status: Within Functional Limits for tasks assessed                                     General Comments  pt family present    Exercises     Shoulder Instructions      Home Living Family/patient expects to be discharged to:: Private residence Living Arrangements: Spouse/significant other Available Help at Discharge: Family Type of Home: House Home Access: Stairs to enter Technical brewer of Steps: 4 Entrance Stairs-Rails: Left Home Layout: One level     Bathroom Shower/Tub: Tub/shower unit         Home Equipment: Cane - single point;Walker - 4 wheels          Prior Functioning/Environment Level of Independence: Independent with assistive device(s)        Comments: uses cane for outdoor ambulation; was assisting to care for spouse who is almost legally blind         OT Problem List: Decreased strength;Impaired  balance (sitting and/or standing);Pain;Decreased range of motion;Impaired UE functional use;Decreased activity tolerance      OT Treatment/Interventions: DME and/or AE instruction;Therapeutic activities;Balance training;Self-care/ADL training;Therapeutic exercise;Patient/family education    OT Goals(Current goals can be found in the care plan section) Acute Rehab OT Goals Patient Stated Goal: "figure out what's going on with my wrist." OT Goal Formulation: With patient Time For Goal Achievement: 11/08/17 Potential to Achieve Goals: Good  OT Frequency: Min 2X/week   Barriers to D/C:            Co-evaluation              AM-PAC PT "6 Clicks" Daily Activity     Outcome Measure Help from another person eating meals?: None Help from another person taking care of personal grooming?: A Little Help from another person toileting, which includes using toliet, bedpan, or urinal?: A Little Help from another person bathing (including washing, rinsing, drying)?: A Lot Help from another person to put on and taking off regular upper body clothing?: A Little Help from another person to put on and taking off regular lower body clothing?: A Lot 6 Click Score: 17   End of Session Equipment Utilized During Treatment: Gait belt Nurse Communication: Mobility status  Activity Tolerance: Patient tolerated treatment well Patient left: in bed;with call bell/phone within reach;with family/visitor present  OT Visit  Diagnosis: Muscle weakness (generalized) (M62.81)                Time: 6606-0045 OT Time Calculation (min): 31 min Charges:  OT General Charges $OT Visit: 1 Visit OT Evaluation $OT Eval Moderate Complexity: 1 Mod OT Treatments $Self Care/Home Management : 8-22 mins  Lou Cal, OT Pager [phone] 10/25/2017   Raymondo Band 10/25/2017, 5:16 PM

## 2017-10-25 NOTE — Progress Notes (Addendum)
STROKE TEAM PROGRESS NOTE  HPI: ( Dr Cheral Marker )Diana Simmons is an 77 y.o. female who noticed RUE pain from her fingertips to her elbow on awakening at 0730. She then noticed RUE weakness, especially involving the hand. She went to see her dentist for a scheduled appointment, and was told to go see a doctor for the arm deficit. Her PCP then sent her to the ED for evaluation via EMS. LKN was 0600 when she had gotten up to go to the bathroom and felt normal. EMS noted the patient to have RLE weakness as well as RUE weakness. She also had dysarthric speech.  She has a prior history of stroke and is on Coumadin. She is allergic to ASA and Plavix. She has a history of CEA. Also with a history of Parkinson's disease.  Stroke risk factors include prior CVA, CAD, HTN.  LSN: 0600 tPA Given: No: On Coumadin NIHSS: 4  INTERVAL HISTORY No family is at the bedside.  She woke with R wrist red yesterday, went to the DDS but didn't feel well. She has a hx of stroke, but it affected her L side. Her R arm felt heavy, able to walk but not able to lift her leg off bed after arrival to hospital. Martin Majestic to PCP after DDS, who sent her to the ED. MRI neg. Hx CEA. Will do stroke workup.  Pt continued to have increasing R hand weakness during the night.   Vitals:   10/25/17 0349 10/25/17 0411 10/25/17 0549 10/25/17 0818  BP: 125/61   (!) 103/58  Pulse: 64  63 62  Resp: 17  18 18   Temp:    98.2 F (36.8 C)  TempSrc:    Oral  SpO2: 94%   97%  Weight:  84.1 kg      CBC:  Recent Labs  Lab 10/24/17 1125 10/24/17 1138 10/25/17 0403  WBC 9.0  --  8.9  NEUTROABS 6.9  --   --   HGB 10.6* 10.2* 10.6*  HCT 32.9* 30.0* 32.3*  MCV 89.9  --  89.2  PLT 223  --  660    Basic Metabolic Panel:  Recent Labs  Lab 10/24/17 1125 10/24/17 1138 10/25/17 0403  NA 140 140 139  K 3.9 3.8 3.4*  CL 110 107 105  CO2 23  --  24  GLUCOSE 90 86 99  BUN 13 14 15   CREATININE 0.94 0.90 0.93  CALCIUM 8.9  --  8.9   Lipid  Panel:     Component Value Date/Time   CHOL 115 10/25/2017 0403   TRIG 71 10/25/2017 0403   HDL 36 (L) 10/25/2017 0403   CHOLHDL 3.2 10/25/2017 0403   VLDL 14 10/25/2017 0403   LDLCALC 65 10/25/2017 0403   HgbA1c:  Lab Results  Component Value Date   HGBA1C 5.4 10/25/2017   Urine Drug Screen: No results found for: LABOPIA, COCAINSCRNUR, LABBENZ, AMPHETMU, THCU, LABBARB  Alcohol Level No results found for: [facility]  IMAGING Dg Chest 2 View  Result Date: 10/24/2017 CLINICAL DATA:  Dyspnea this morning with hypertension. No chest pain. EXAM: CHEST - 2 VIEW COMPARISON:  01/28/2017 FINDINGS: The heart size and mediastinal contours are within normal limits. Stable aortic atherosclerosis without aneurysm. Stable ovoid nodular opacity in the right mid lung may reflect a small granuloma or pulmonary vessel seen on end. No confluent airspace disease. There is minimal atelectasis at the left lung base laterally. Osteoarthritis of the included right acromioclavicular joint and both glenohumeral  joints. Degenerative changes are stable along the dorsal spine. IMPRESSION: No active cardiopulmonary disease. Minimal atelectasis at the left lung base. Electronically Signed   By: Ashley Royalty M.D.   On: 10/24/2017 16:22   Dg Wrist Complete Right  Result Date: 10/24/2017 CLINICAL DATA:  Wrist swelling EXAM: RIGHT WRIST - COMPLETE 3+ VIEW COMPARISON:  None. FINDINGS: No acute displaced fracture or malalignment. Widened scapholunate interval. Cyst base of first metacarpal with moderate arthritis at the first [facility] joint. Possible small erosions, distal pole of scaphoid and at the triquetrum bone. Volar soft tissue swelling. IMPRESSION: 1. No acute osseous abnormality. 2. Widening of the scaffold lunate interval consistent with ligamentous abnormality. 3. Arthritis at the first [facility] joint. Possible tiny erosions at the carpal bones, question inflammatory arthritis Electronically Signed   By: Donavan Foil M.D.   On:  10/24/2017 20:58   Mr Jodene Nam Head Wo Contrast  Result Date: 10/24/2017 CLINICAL DATA:  77 y/o F; right upper extremity pain and weakness with headache, blurred vision, right leg weakness, and slurred speech. EXAM: MRI HEAD WITHOUT CONTRAST MRA HEAD WITHOUT CONTRAST TECHNIQUE: Multiplanar, multiecho pulse sequences of the brain and surrounding structures were obtained without intravenous contrast. Angiographic images of the head were obtained using MRA technique without contrast. COMPARISON:  10/24/2017 CT head.  01/05/2009 MRI head. FINDINGS: MRI HEAD FINDINGS Brain: No acute infarction, hemorrhage, hydrocephalus, extra-axial collection or mass lesion. Several nonspecific T2 FLAIR hyperintensities in subcortical and periventricular white matter are compatible with moderate chronic microvascular ischemic changes for age. Moderate volume loss of the brain. Mild progression from 2010 MRI. No asymmetric volume loss within the midbrain or pons. Vascular: As below. Skull and upper cervical spine: Normal marrow signal. Sinuses/Orbits: Mild paranasal sinus mucosal thickening. No abnormal signal of mastoid air cells. Bilateral intra-ocular lens replacement. Other: None. MRA HEAD FINDINGS Internal carotid arteries:  Patent. Anterior cerebral arteries:  Patent. Middle cerebral arteries: Patent. Anterior communicating artery: Patent. Posterior communicating arteries: Patent left. No right identified, likely hypoplastic or absent. Posterior cerebral arteries:  Patent. Basilar artery:  Patent. Vertebral arteries:  Patent. Motion degraded study. Suboptimal assessment for small 2-3 mm aneurysm or stenosis. No large aneurysm identified. IMPRESSION: MRI head: 1. No acute intracranial abnormality identified. 2. Moderate chronic microvascular ischemic changes and volume loss of the brain with mild progression from 2010. MRA head: 1. Motion degraded study. Suboptimal assessment for small aneurysm or stenosis. 2. Patent anterior and  posterior intracranial circulation. No large vessel occlusion. Electronically Signed   By: Kristine Garbe M.D.   On: 10/24/2017 21:17   Mr Brain Wo Contrast  Result Date: 10/24/2017 CLINICAL DATA:  77 y/o F; right upper extremity pain and weakness with headache, blurred vision, right leg weakness, and slurred speech. EXAM: MRI HEAD WITHOUT CONTRAST MRA HEAD WITHOUT CONTRAST TECHNIQUE: Multiplanar, multiecho pulse sequences of the brain and surrounding structures were obtained without intravenous contrast. Angiographic images of the head were obtained using MRA technique without contrast. COMPARISON:  10/24/2017 CT head.  01/05/2009 MRI head. FINDINGS: MRI HEAD FINDINGS Brain: No acute infarction, hemorrhage, hydrocephalus, extra-axial collection or mass lesion. Several nonspecific T2 FLAIR hyperintensities in subcortical and periventricular white matter are compatible with moderate chronic microvascular ischemic changes for age. Moderate volume loss of the brain. Mild progression from 2010 MRI. No asymmetric volume loss within the midbrain or pons. Vascular: As below. Skull and upper cervical spine: Normal marrow signal. Sinuses/Orbits: Mild paranasal sinus mucosal thickening. No abnormal signal of mastoid air cells. Bilateral intra-ocular  lens replacement. Other: None. MRA HEAD FINDINGS Internal carotid arteries:  Patent. Anterior cerebral arteries:  Patent. Middle cerebral arteries: Patent. Anterior communicating artery: Patent. Posterior communicating arteries: Patent left. No right identified, likely hypoplastic or absent. Posterior cerebral arteries:  Patent. Basilar artery:  Patent. Vertebral arteries:  Patent. Motion degraded study. Suboptimal assessment for small 2-3 mm aneurysm or stenosis. No large aneurysm identified. IMPRESSION: MRI head: 1. No acute intracranial abnormality identified. 2. Moderate chronic microvascular ischemic changes and volume loss of the brain with mild progression  from 2010. MRA head: 1. Motion degraded study. Suboptimal assessment for small aneurysm or stenosis. 2. Patent anterior and posterior intracranial circulation. No large vessel occlusion. Electronically Signed   By: Kristine Garbe M.D.   On: 10/24/2017 21:17   Ct Head Code Stroke Wo Contrast  Result Date: 10/24/2017 CLINICAL DATA:  Code stroke. Focal neuro deficit, less than 6 hours, stroke suspected. The patient awoke 4 hours ago with right lower extremity weakness and abnormal speech EXAM: CT HEAD WITHOUT CONTRAST TECHNIQUE: Contiguous axial images were obtained from the base of the skull through the vertex without intravenous contrast. COMPARISON:  CT head without contrast 01/27/2017 FINDINGS: Brain: Mild atrophy and white matter disease is similar to the prior exam. No acute infarct, hemorrhage, or mass lesion is present. The ventricles are of normal size. Basal ganglia are intact. The insular ribbon is normal. No acute or focal cortical abnormalities are present or new. The brainstem and cerebellum is normal. Hypoattenuation in the right frontal lobe on image 18 is artifactual. Vascular: Atherosclerotic calcifications are present within the cavernous internal carotid arteries and at the dural margin of the left vertebral artery. There is no hyperdense vessel. Skull: Calvarium is intact. Degenerative changes are present at C1-2. No focal lytic or blastic lesions are present. No significant extracranial soft tissue lesions are present. Sinuses/Orbits: Mild mucosal thickening is present within the ethmoid air cells and inferior maxillary sinuses. Paranasal sinuses and mastoid air cells are otherwise clear. No obstructing lesions are present. ASPECTS ([place] Stroke Program Early CT Score) - Ganglionic level infarction (caudate, lentiform nuclei, internal capsule, insula, M1-M3 cortex): 7/7 - Supraganglionic infarction (M4-M6 cortex): 3/3 Total score (0-10 with 10 being normal): 10/10 IMPRESSION: 1.  Stable atrophy and white matter disease, likely reflecting the sequela of chronic microvascular ischemia. 2. No acute intracranial abnormality. 3. Minimal sinus disease. 4. Atherosclerosis. 5. ASPECTS is 10/10 The above was relayed via text pager to Dr. Cheral Marker on 10/24/2017 at 11:44 . Electronically Signed   By: San Morelle M.D.   On: 10/24/2017 11:45   - Left ventricle: The cavity size was normal. There was mild   concentric hypertrophy. Systolic function was normal. The   estimated ejection fraction was in the range of 55% to 60%. Wall   motion was normal; there were no regional wall motion   abnormalities. - Aortic valve: Trileaflet; mildly thickened, mildly calcified   leaflets. There was mild regurgitation. - Mitral valve: Moderately calcified annulus. There was trivial   regurgitation.  Impressions:  - No cardiac source of emboli was indentified.   PHYSICAL EXAM Pleasant elderly lady currently not in distress. She has swelling and pain of the right wrist with some erythema and it is tender to touch with restriction of movements  . Afebrile. Head is nontraumatic. Neck is supple with soft right carotid bruit. Prior carotid surgery scar noted in the right neck   Cardiac exam no murmur or gallop. Lungs are clear to auscultation. Distal  pulses are well felt.  Neurological Exam ;  Awake  Alert oriented x 3. Normal speech and language.eye movements full without nystagmus.fundi were not visualized. Vision acuity and fields appear normal. Hearing is normal. Palatal movements are normal. Face symmetric. Tongue midline. Normal strength, tone, reflexes and coordination except right hand movements restricted due to pain and swelling. Poor effort in the right lower extremity and complains of hip pain which may be contributing. Normal sensation. Gait deferred.  ASSESSMENT/PLAN Diana Simmons is a 77 y.o. female with history of CVA, angina on warfarin, CEA, HTN, CAD presenting with R  wrist pain with R hand weakness, RLE heaviness, possible vision changes..   Stroke-like episode (not TIA as sx still present, no stroke seen on MRI)  Code Stroke CT head No acute stroke. Small vessel disease. Atrophy. Minimal sinus dz. atherosclerosis  ASPECTS 10.     MRI  No acute stroke  MRA  Unremarkable   Carotid Doppler  pending   2D Echo  EF 55-60%. No source of embolus   LDL 65  HgbA1c 5.4  Warfarin for VTE prophylaxis  warfarin daily prior to admission INR 2.11 on adm, 1.98 now (she is allergic to plavix and aspirin), she was placed on warfarin by her cardiologist 9 years ago d/t constant CP d/t cardiac SVD that resolved on warfarin. Now on No antithrombotic. Ok to resume warfarin and continue at d/c. Discussed with Dr. Evette Doffing and primary teamand recommend discussion with cardiologist Dr. Bettina Gavia to see if she did still needs warfarin can be changed to [facility] or some other antiplatelet agent that she is not allergic to.   Therapy recommendations:  pending   Disposition:  pending   Complete stroke workup  Follow up Dr. Nadara Eaton, neurologist  Hypertensiion  SBP > 230 on arrival to her PCP office yesterday  Improved this am down to 103 . BP goal normotensive  Hyperlipidemia  Home meds:  lipitor 7  Ok to resume in hospital  LDL 65, at goal < 70  Continue statin at discharge  Other Stroke Risk Factors  Advanced age  Former Cigarette smoker, quit 29 yrs ago  Obesity, Body mass index is 35.03 kg/m., recommend weight loss, diet and exercise as appropriate   Hx stroke/TIA  Coronary artery disease  Other Active Problems  Parkinson's like symptoms, resolved. Not felt to be Parkinson's. Followed by [facility], Dr. Nadara Eaton   R wrist pain, R hand weakness, ? Cellulitis - primary team addressing  Orthopnea, wt gain, dry cough  Hospital day # 1  Burnetta Sabin, MSN, APRN, ANVP-BC, AGPCNP-BC Advanced Practice Stroke Nurse [place] for  Schedule & Pager information 10/25/2017 10:08 AM  I have personally examined this patient, reviewed notes, independently viewed imaging studies, participated in medical decision making and plan of care.ROS completed by me personally and pertinent positives fully documented  I have made any additions or clarifications directly to the above note. Agree with note above.  Presented with right hand pain and swelling followed by subjective weakness which may likely be from pseudogout in the right wrist. Brain imaging is negative negative for stroke. Patient's exam is limited by her pain and doubt she has objective true weakness. Continue ongoing stroke workup given prior history of strokes and carotid surgery. It may be worthwhile discussing with cardiologist Dr. Bettina Gavia whether she needs to stay on warfarin as there is no definite indication for it.discussed with patient, Dr. Evette Doffing and internal medicine teaching team and answered  questions. Greater than 50% time during this 35 minute visit was spent on counseling and coordination of care about TIA and stroke risk and answering questions.  Antony Contras, MD Medical Director [facility] Pager: [phone] 10/25/2017 3:04 PM  To contact Stroke Continuity provider, please refer to [email or web address] After hours, contact General Neurology

## 2017-10-25 NOTE — Procedures (Addendum)
Wrist Arthrocentesis with Injection Procedure Note  Indications: Unexplained joint effusion  Anesthesia: Lidocaine 1% without epinephrine  Procedure Details   After risks and benefits were explained including bleeding, infection, tendon damage or rupture, allergic reaction to medications, vascular injection, and nerve damage, signed consent was obtained.  All questions were answered.    Point of care ultrasound was used to identify the joint effusion and plan needle trajectory and depth. The extensor surface of the joint was prepped with Betadine. A 21 gauge 1.5 inch needle was inserted perpendicularly into the joint space just distal to Lister's tubercle and medial to the extensor pollicus longus tendon, injecting 10mL lidocaine without Epi. This needle was removed and a 10cc syringe with a 27g needle was inserted. 1.0 ml of cloudy, red fluid was removed from the joint and sent to the lab for analysis. 1 ml 1% lidocaine without Epi and 1 ml of Triamcinolone was then injected into the joint through the same needle. The needle was removed and the area cleansed and covered with a band aid.  Complications:  None; patient tolerated the procedure well.   Francesco Runner, MD 10/25/17 4:54pm

## 2017-10-25 NOTE — Evaluation (Signed)
Physical Therapy Evaluation Patient Details Name: Diana Simmons MRN: [id number] DOB: 1940-07-27 Today's Date: 10/25/2017   History of Present Illness  Pt is a 77 y.o. F with significant PMH of Parkinson disease, HTN, OSA (not on CPAP), and h/o TIAs in early 2000s with residual left arm and leg weakness as well as MI. Presenting with right arm pain, weakness, and blurred vision. She now states blurred vision has resolved. CT and MRI negative for acute ischemic events. Xray of right wrist negative for acute fracture.   Clinical Impression  At baseline, patient is independent with ADL's and uses a cane for community ambulation. On PT evaluation, patient presenting with decreased functional mobility secondary to right arm/wrist pain, right UE and LE weakness, gait abnormalities, balance deficits, and decreased endurance. Ambulating 72 feet with cane and min assist. Displays decreased right knee control/stability throughout gait as well as decreased bilateral foot clearance. Pt current gait speed is indicative that she is at high risk for falls. Currently recommending CIR to maximize functional independence and decrease caregiver burden. Will continue to reassess discharge plan and progress mobility acutely.    Follow Up Recommendations CIR    Equipment Recommendations  None recommended by PT    Recommendations for Other Services Rehab consult     Precautions / Restrictions Precautions Precautions: Fall Precaution Comments: right wrist pain Restrictions Weight Bearing Restrictions: No      Mobility  Bed Mobility Overal bed mobility: Needs Assistance Bed Mobility: Supine to Sit     Supine to sit: Supervision     General bed mobility comments: supervision with HOB elevated  Transfers Overall transfer level: Needs assistance Equipment used: Straight cane Transfers: Sit to/from Stand Sit to Stand: Min assist         General transfer comment: Light min assist to stand with cues  for hand placement  Ambulation/Gait Ambulation/Gait assistance: Min assist Gait Distance (Feet): 72 Feet Assistive device: Straight cane Gait Pattern/deviations: Step-through pattern;Decreased stride length Gait velocity: decreased Gait velocity interpretation: <1.31 ft/sec, indicative of household ambulator General Gait Details: Patient requiring min assist for ambulation with decreased bilateral foot clearance and difficulty achieve knee extension during midstance (increased knee flexion). Decreased right knee control throughout gait but no knee buckle noted  Stairs            Wheelchair Mobility    Modified Rankin (Stroke Patients Only) Modified Rankin (Stroke Patients Only) Pre-Morbid Rankin Score: No significant disability Modified Rankin: Moderately severe disability     Balance Overall balance assessment: Needs assistance Sitting-balance support: No upper extremity supported;Feet supported Sitting balance-Leahy Scale: Good     Standing balance support: Single extremity supported;During functional activity Standing balance-Leahy Scale: Fair   Single Leg Stance - Right Leg: 0 Single Leg Stance - Left Leg: 0 Tandem Stance - Right Leg: 0 Tandem Stance - Left Leg: 0 Rhomberg - Eyes Opened: 10(lateral sway)                   Pertinent Vitals/Pain Pain Assessment: Faces Pain Score: 4  Faces Pain Scale: Hurts little more Pain Location: right wrist red and swollen, decreased functional use; right arm pain with elevation Pain Descriptors / Indicators: Discomfort;Grimacing Pain Intervention(s): Limited activity within patient's tolerance;Monitored during session    Home Living Family/patient expects to be discharged to:: Private residence Living Arrangements: Spouse/significant other Available Help at Discharge: Family Type of Home: House Home Access: Stairs to enter Entrance Stairs-Rails: Left Entrance Stairs-Number of Steps: 4 Home Layout: One  level Home Equipment: Cane - single point;Walker - 4 wheels      Prior Function Level of Independence: Independent with assistive device(s)         Comments: uses cane for outdoor ambulation     Hand Dominance        Extremity/Trunk Assessment   Upper Extremity Assessment Upper Extremity Assessment: RUE deficits/detail;LUE deficits/detail RUE Deficits / Details: 5/5 LUE Deficits / Details: Unable to formally assess due to pain but at least anti gravity with shoulder and elbow. Wrist limited in functional use secondary to pain and swelling; red and warm to touch    Lower Extremity Assessment Lower Extremity Assessment: RLE deficits/detail;LLE deficits/detail RLE Deficits / Details: 5/5 LLE Deficits / Details: Grossly 4/5 except hip flexion 3+/5       Communication   Communication: No difficulties  Cognition Arousal/Alertness: Awake/alert Behavior During Therapy: WFL for tasks assessed/performed Overall Cognitive Status: Within Functional Limits for tasks assessed                                        General Comments General comments (skin integrity, edema, etc.): pt family present    Exercises     Assessment/Plan    PT Assessment Patient needs continued PT services  PT Problem List Decreased strength;Decreased range of motion;Decreased activity tolerance;Decreased balance;Decreased mobility;Pain       PT Treatment Interventions DME instruction;Gait training;Stair training;Functional mobility training;Therapeutic exercise;Therapeutic activities;Balance training;Patient/family education;Neuromuscular re-education    PT Goals (Current goals can be found in the Care Plan section)  Acute Rehab PT Goals Patient Stated Goal: "figure out what's going on with my wrist." PT Goal Formulation: With patient/family Time For Goal Achievement: 11/08/17 Potential to Achieve Goals: Good    Frequency Min 4X/week   Barriers to discharge         Co-evaluation               AM-PAC PT "6 Clicks" Daily Activity  Outcome Measure Difficulty turning over in bed (including adjusting bedclothes, sheets and blankets)?: None Difficulty moving from lying on back to sitting on the side of the bed? : A Little Difficulty sitting down on and standing up from a chair with arms (e.g., wheelchair, bedside commode, etc,.)?: A Little Help needed moving to and from a bed to chair (including a wheelchair)?: A Little Help needed walking in hospital room?: A Little Help needed climbing 3-5 steps with a railing? : A Lot 6 Click Score: 18    End of Session Equipment Utilized During Treatment: Gait belt Activity Tolerance: Patient tolerated treatment well Patient left: in bed;with call bell/phone within reach;with family/visitor present Nurse Communication: Mobility status PT Visit Diagnosis: Unsteadiness on feet (R26.81);Other abnormalities of gait and mobility (R26.89);Pain;Difficulty in walking, not elsewhere classified (R26.2) Pain - Right/Left: Left Pain - part of body: Arm    Time: 1325-1351 PT Time Calculation (min) (ACUTE ONLY): 26 min   Charges:   PT Evaluation $PT Eval Moderate Complexity: 1 Mod PT Treatments $Therapeutic Activity: 8-22 mins   Ellamae Sia, PT, DPT Acute Rehabilitation Services  Pager: [place] 10/25/2017, 3:55 PM

## 2017-10-25 NOTE — Progress Notes (Signed)
Internal Medicine Attending:   I saw and examined the patient. I reviewed the resident's note and I agree with the resident's findings and plan as documented in the resident's note.  Hospital day #2 for evaluation of a MRI-negative strokelike syndrome.  Right-sided deficits are resolved today.  We are completing a stroke work-up.  Her biggest complaint now is pain and swelling of her right wrist.  She has a moderate effusion in the wrist, with some overlying erythema of the skin.  No fevers, seems low risk for septic arthritis.  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  We will try to do a wrist aspiration to confirm, and can also provide intra-articular steroid at the same time.

## 2017-10-25 NOTE — Progress Notes (Signed)
Preliminary notes--Right upper extremity venous duplex exam completed. Negative for deep veins and superficial veins thrombosis.  Incidental finding: there is a heterogenous structure seen at right wrist anteriorly, the region where patient complains most pain, measuring 2.71x1.07cm.  Hongying Landry Mellow (RDMS RVT) 10/25/17 12:31 PM

## 2017-10-25 NOTE — Progress Notes (Signed)
   Subjective: Continues to endorse right wrist pain. Night team evaluated her overnight. She does note improvement of the strength in her arms and legs. We performed bedside ultrasound of right wrist and plan to drain the effusion.   Objective:  Vital signs in last 24 hours: Vitals:   10/25/17 0411 10/25/17 0549 10/25/17 0818 10/25/17 1256  BP:   (!) 103/58 (!) 138/50  Pulse:  63 62 60  Resp:  _0 Temp:   98.2 F (36.8 C) 98.8 F (37.1 C)  TempSrc:   Oral Oral  SpO2:   97% 98%  Weight: 84.1 kg      Gen: Ill appearing, NAD CV: RRR, no murmurs Pulm: Normal effort, CTA throughout, no wheezing Abd: Soft, NT, ND, normal BS.  Ext: Warm, no edema, normal joints MSK:  Right wrist swollen, warm, and very tender to palpation. Bedside ultrasound revealed a joint effusion.  Neuro: right leg strength 4/5. Right arm strength 3/5, limited due to pain. Facial droop has resolved. Speech is not as slow  Assessment/Plan:  Active Problems:   Neurological deficit present   Parkinson's disease ([place])   Hypertensive urgency   77 y.o. female p/w right arm pain, numbness and weakness which began at 7 am and extended to involve her face and lower extremity. She was found to have a blood pressure 200s and CT head did not show an acute event. She is on coumadin for history of CVA because she is allergic to aspirin and plavix.   #Stroke like syndrome: CT and MRI negative for acute ischemic event. Right lower extremity strength has improved. Right upper extremity strength is limited due to wrist pain. Facial droop has resolved.  - MRA: too much motion artifact - echo: EF 55-60% - carotid dopplers: bilateral ICAs 40-59%, bilat vertebral arteries patent   - continue coumadin   #Hypertension: - SBP 200 on admission with minimal response to IV hydralazine 5 mg in the ED, scheduled IV hydralazine 10 mg q4h  - responding to hydralazine   #Right arm pain and swelling  - DVT study: negative for  thrombosis. Incidental finding: heterogenous structure (2.7 x 1.07cm) on anterior wrist - CRP 1.9, ESR 40 - bedside US showed effusion, planning to aspirate and send fluid sample to the lab  Dispo: Anticipated discharge in approximately 1-2 day(s).   Isabelle Course, MD 10/25/2017, 2:06 PM Pager: [phone]

## 2017-10-25 NOTE — Discharge Instructions (Signed)

## 2017-10-25 NOTE — Progress Notes (Signed)
Pt reports 10/10 right wrist pain one hour after tylenol given. Reported to Dr. Koleen Distance who stated she will look into interventions. Arlis Porta

## 2017-10-25 NOTE — Progress Notes (Signed)
ANTICOAGULATION CONSULT NOTE - Initial Consult  Pharmacy Consult for Warfarin Indication: Hx CVA  Allergies  Allergen Reactions  . Ivp Dye [Iodinated Diagnostic Agents] Anaphylaxis  . Amlodipine     unknown  . Aspirin-Dipyridamole Er     Aggrenox  . Benadryl [Diphenhydramine]     unknown  . Dipyridamole     unknown  . Lovastatin     unknown  . Simvastatin   . Sulfa Antibiotics     unknown  . Clopidogrel Rash  . Rasagiline Nausea Only    Patient Measurements: Weight: 185 lb 6.5 oz (84.1 kg)  Vital Signs: Temp: 98.8 F (37.1 C) (08/22 1256) Temp Source: Oral (08/22 1256) BP: 138/50 (08/22 1256) Pulse Rate: 60 (08/22 1256)  Labs: Recent Labs    10/24/17 1125 10/24/17 1138 10/25/17 0403  HGB 10.6* 10.2* 10.6*  HCT 32.9* 30.0* 32.3*  PLT 223  --  212  APTT 42*  --   --   LABPROT 23.5*  --  22.3*  INR 2.11  --  1.98  CREATININE 0.94 0.90 0.93    Estimated Creatinine Clearance: 48.2 mL/min (by C-G formula based on SCr of 0.93 mg/dL).   Medical History: Past Medical History:  Diagnosis Date  . Cerebrovascular accident (CVA) ([place]) 02/15/2016  . Chronic anticoagulation 11/22/2014  . Coronary artery disease   . Coronary artery disease involving native coronary artery of native heart with angina pectoris ([place]) 11/22/2014   Overview:  Diffuse disease not amenable to PCI  . Falls, sequela 02/15/2016  . H/O carotid endarterectomy 02/15/2016  . Hypertension   . Lack of coordination 09/24/2012  . Parkinson's disease ([place]) 09/24/2012  . Stroke ([place])   . Tremor 02/15/2016    Assessment: 18 YOF on warfarin PTA for hx recurrent TIAs/CVA who presented on 8/21 with concern for new acute CVA however imaging negative. Admit INR 2.11 on PTA dose of 6.5 mg/day - last dose 8/20. The dose was missed on 8/21. Pharmacy consulted to resume dosing this admission.  INR SUBtherapeutic today (INR 1.98 << 2.11, goal of 2-3). Hgb/Hct low but stable, plts wnl. No bleeding noted. Will  give a slightly higher dose today due to the missed dose on 8/21.   Goal of Therapy:  INR 2-3 Monitor platelets by anticoagulation protocol: Yes   Plan:  - Warfarin 8 mg x 1 dose at 1800 today - Will continue to monitor for any signs/symptoms of bleeding and will follow up with PT/INR in the a.m.   Thank you for allowing pharmacy to be a part of this patient's care.  Alycia Rossetti, PharmD, BCPS Clinical Pharmacist Pager: [phone] Clinical phone for 10/25/2017 from 7a-3:30p: [phone] If after 3:30p, please call main pharmacy at: [phone] Please check AMION for all [place] numbers 10/25/2017 1:31 PM

## 2017-10-26 DIAGNOSIS — Z7982 Long term (current) use of aspirin: Secondary | ICD-10-CM

## 2017-10-26 DIAGNOSIS — M11231 Other chondrocalcinosis, right wrist: Secondary | ICD-10-CM

## 2017-10-26 DIAGNOSIS — Z882 Allergy status to sulfonamides status: Secondary | ICD-10-CM

## 2017-10-26 DIAGNOSIS — G2581 Restless legs syndrome: Secondary | ICD-10-CM

## 2017-10-26 DIAGNOSIS — Z91048 Other nonmedicinal substance allergy status: Secondary | ICD-10-CM

## 2017-10-26 DIAGNOSIS — R29818 Other symptoms and signs involving the nervous system: Secondary | ICD-10-CM | POA: Diagnosis not present

## 2017-10-26 DIAGNOSIS — M25431 Effusion, right wrist: Secondary | ICD-10-CM | POA: Diagnosis not present

## 2017-10-26 DIAGNOSIS — I10 Essential (primary) hypertension: Secondary | ICD-10-CM | POA: Diagnosis not present

## 2017-10-26 DIAGNOSIS — Z8673 Personal history of transient ischemic attack (TIA), and cerebral infarction without residual deficits: Secondary | ICD-10-CM | POA: Diagnosis not present

## 2017-10-26 LAB — PROTIME-INR
INR: 1.72
PROTHROMBIN TIME: 20 s — AB (ref 11.4–15.2)

## 2017-10-26 LAB — SYNOVIAL FLUID, CRYSTAL

## 2017-10-26 MED ORDER — ATORVASTATIN CALCIUM 40 MG PO TABS: 40.0000 mg | ORAL_TABLET | Freq: Every day | ORAL | Status: DC

## 2017-10-26 MED ORDER — ASPIRIN EC 81 MG PO TBEC: 81.0000 mg | DELAYED_RELEASE_TABLET | Freq: Every day | ORAL | 2 refills | Status: AC

## 2017-10-26 MED ORDER — WARFARIN SODIUM 6 MG PO TABS
9.0000 mg | ORAL_TABLET | Freq: Once | ORAL | Status: DC
  Filled 2017-10-26: qty 1

## 2017-10-26 MED ORDER — LISINOPRIL 10 MG PO TABS: 10.0000 mg | ORAL_TABLET | Freq: Every day | ORAL | Status: DC

## 2017-10-26 NOTE — Care Management Note (Signed)
Case Management Note  Patient Details  Name: Diana Simmons MRN: [id number] Date of Birth: Jun 21, 1940  Subjective/Objective:   Pt in with neurologic deficit. She is from home with her spouse that is legally blind.  PCP:     Dr Nona Dell Insurance: [facility] medicare             Action/Plan: Pt discharging home with orders for [facility] services. CM provided the patient and daughter choice and they selected [place]. Butch Penny with [facility] notified and accepted the referral.  Daughter to provide transport home and supervision for a few days.  Expected Discharge Date:  10/26/17               Expected Discharge Plan:  [place]  In-House Referral:     Discharge planning Services  CM Consult  Post Acute Care Choice:  Home Health Choice offered to:  Patient, Adult Children  DME Arranged:    DME Agency:     HH Arranged:  PT, OT, Nurse's Aide [place] Agency:  [place]  Status of Service:  Completed, signed off  If discussed at Section of Stay Meetings, dates discussed:    Additional Comments:  Pollie Friar, RN 10/26/2017, 3:58 PM

## 2017-10-26 NOTE — Progress Notes (Signed)
STROKE TEAM PROGRESS NOTE     INTERVAL HISTORY  daughter is at the bedside. Patient states her right hand swelling and pain is a lot improved. She denies any weakness in the right side today.  Vitals:   10/26/17 0553 10/26/17 0554 10/26/17 0736 10/26/17 1223  BP:   (!) 181/74 (!) 145/71  Pulse:   (!) 59 60  Resp: 18  18 18   Temp:   98.2 F (36.8 C) 98.5 F (36.9 C)  TempSrc: Oral  Oral Oral  SpO2:   97% 97%  Weight: 84.4 kg     Height:  5\' 6"  (1.676 m)      CBC:  Recent Labs  Lab 10/24/17 1125 10/24/17 1138 10/25/17 0403  WBC 9.0  --  8.9  NEUTROABS 6.9  --   --   HGB 10.6* 10.2* 10.6*  HCT 32.9* 30.0* 32.3*  MCV 89.9  --  89.2  PLT 223  --  338    Basic Metabolic Panel:  Recent Labs  Lab 10/24/17 1125 10/24/17 1138 10/25/17 0403  NA 140 140 139  K 3.9 3.8 3.4*  CL 110 107 105  CO2 23  --  24  GLUCOSE 90 86 99  BUN 13 14 15   CREATININE 0.94 0.90 0.93  CALCIUM 8.9  --  8.9   Lipid Panel:     Component Value Date/Time   CHOL 115 10/25/2017 0403   TRIG 71 10/25/2017 0403   HDL 36 (L) 10/25/2017 0403   CHOLHDL 3.2 10/25/2017 0403   VLDL 14 10/25/2017 0403   LDLCALC 65 10/25/2017 0403   HgbA1c:  Lab Results  Component Value Date   HGBA1C 5.4 10/25/2017   Urine Drug Screen: No results found for: LABOPIA, COCAINSCRNUR, LABBENZ, AMPHETMU, THCU, LABBARB  Alcohol Level No results found for: [facility]  IMAGING Dg Chest 2 View  Result Date: 10/24/2017 CLINICAL DATA:  Dyspnea this morning with hypertension. No chest pain. EXAM: CHEST - 2 VIEW COMPARISON:  01/28/2017 FINDINGS: The heart size and mediastinal contours are within normal limits. Stable aortic atherosclerosis without aneurysm. Stable ovoid nodular opacity in the right mid lung may reflect a small granuloma or pulmonary vessel seen on end. No confluent airspace disease. There is minimal atelectasis at the left lung base laterally. Osteoarthritis of the included right acromioclavicular joint and both  glenohumeral joints. Degenerative changes are stable along the dorsal spine. IMPRESSION: No active cardiopulmonary disease. Minimal atelectasis at the left lung base. Electronically Signed   By: Ashley Royalty M.D.   On: 10/24/2017 16:22   Dg Wrist Complete Right  Result Date: 10/24/2017 CLINICAL DATA:  Wrist swelling EXAM: RIGHT WRIST - COMPLETE 3+ VIEW COMPARISON:  None. FINDINGS: No acute displaced fracture or malalignment. Widened scapholunate interval. Cyst base of first metacarpal with moderate arthritis at the first [facility] joint. Possible small erosions, distal pole of scaphoid and at the triquetrum bone. Volar soft tissue swelling. IMPRESSION: 1. No acute osseous abnormality. 2. Widening of the scaffold lunate interval consistent with ligamentous abnormality. 3. Arthritis at the first [facility] joint. Possible tiny erosions at the carpal bones, question inflammatory arthritis Electronically Signed   By: Donavan Foil M.D.   On: 10/24/2017 20:58   Mr Jodene Nam Head Wo Contrast  Result Date: 10/24/2017 CLINICAL DATA:  77 y/o F; right upper extremity pain and weakness with headache, blurred vision, right leg weakness, and slurred speech. EXAM: MRI HEAD WITHOUT CONTRAST MRA HEAD WITHOUT CONTRAST TECHNIQUE: Multiplanar, multiecho pulse sequences of the brain and  surrounding structures were obtained without intravenous contrast. Angiographic images of the head were obtained using MRA technique without contrast. COMPARISON:  10/24/2017 CT head.  01/05/2009 MRI head. FINDINGS: MRI HEAD FINDINGS Brain: No acute infarction, hemorrhage, hydrocephalus, extra-axial collection or mass lesion. Several nonspecific T2 FLAIR hyperintensities in subcortical and periventricular white matter are compatible with moderate chronic microvascular ischemic changes for age. Moderate volume loss of the brain. Mild progression from 2010 MRI. No asymmetric volume loss within the midbrain or pons. Vascular: As below. Skull and upper cervical spine:  Normal marrow signal. Sinuses/Orbits: Mild paranasal sinus mucosal thickening. No abnormal signal of mastoid air cells. Bilateral intra-ocular lens replacement. Other: None. MRA HEAD FINDINGS Internal carotid arteries:  Patent. Anterior cerebral arteries:  Patent. Middle cerebral arteries: Patent. Anterior communicating artery: Patent. Posterior communicating arteries: Patent left. No right identified, likely hypoplastic or absent. Posterior cerebral arteries:  Patent. Basilar artery:  Patent. Vertebral arteries:  Patent. Motion degraded study. Suboptimal assessment for small 2-3 mm aneurysm or stenosis. No large aneurysm identified. IMPRESSION: MRI head: 1. No acute intracranial abnormality identified. 2. Moderate chronic microvascular ischemic changes and volume loss of the brain with mild progression from 2010. MRA head: 1. Motion degraded study. Suboptimal assessment for small aneurysm or stenosis. 2. Patent anterior and posterior intracranial circulation. No large vessel occlusion. Electronically Signed   By: Kristine Garbe M.D.   On: 10/24/2017 21:17   Mr Brain Wo Contrast  Result Date: 10/24/2017 CLINICAL DATA:  77 y/o F; right upper extremity pain and weakness with headache, blurred vision, right leg weakness, and slurred speech. EXAM: MRI HEAD WITHOUT CONTRAST MRA HEAD WITHOUT CONTRAST TECHNIQUE: Multiplanar, multiecho pulse sequences of the brain and surrounding structures were obtained without intravenous contrast. Angiographic images of the head were obtained using MRA technique without contrast. COMPARISON:  10/24/2017 CT head.  01/05/2009 MRI head. FINDINGS: MRI HEAD FINDINGS Brain: No acute infarction, hemorrhage, hydrocephalus, extra-axial collection or mass lesion. Several nonspecific T2 FLAIR hyperintensities in subcortical and periventricular white matter are compatible with moderate chronic microvascular ischemic changes for age. Moderate volume loss of the brain. Mild progression  from 2010 MRI. No asymmetric volume loss within the midbrain or pons. Vascular: As below. Skull and upper cervical spine: Normal marrow signal. Sinuses/Orbits: Mild paranasal sinus mucosal thickening. No abnormal signal of mastoid air cells. Bilateral intra-ocular lens replacement. Other: None. MRA HEAD FINDINGS Internal carotid arteries:  Patent. Anterior cerebral arteries:  Patent. Middle cerebral arteries: Patent. Anterior communicating artery: Patent. Posterior communicating arteries: Patent left. No right identified, likely hypoplastic or absent. Posterior cerebral arteries:  Patent. Basilar artery:  Patent. Vertebral arteries:  Patent. Motion degraded study. Suboptimal assessment for small 2-3 mm aneurysm or stenosis. No large aneurysm identified. IMPRESSION: MRI head: 1. No acute intracranial abnormality identified. 2. Moderate chronic microvascular ischemic changes and volume loss of the brain with mild progression from 2010. MRA head: 1. Motion degraded study. Suboptimal assessment for small aneurysm or stenosis. 2. Patent anterior and posterior intracranial circulation. No large vessel occlusion. Electronically Signed   By: Kristine Garbe M.D.   On: 10/24/2017 21:17   - Left ventricle: The cavity size was normal. There was mild   concentric hypertrophy. Systolic function was normal. The   estimated ejection fraction was in the range of 55% to 60%. Wall   motion was normal; there were no regional wall motion   abnormalities. - Aortic valve: Trileaflet; mildly thickened, mildly calcified   leaflets. There was mild regurgitation. - Mitral valve:  Moderately calcified annulus. There was trivial   regurgitation.  Impressions:  - No cardiac source of emboli was indentified.   PHYSICAL EXAM Pleasant elderly lady currently not in distress.   . Afebrile. Head is nontraumatic. Neck is supple with soft right carotid bruit. Prior carotid surgery scar noted in the right neck   Cardiac exam  no murmur or gallop. Lungs are clear to auscultation. Distal pulses are well felt.  Neurological Exam ;  Awake  Alert oriented x 3. Normal speech and language.eye movements full without nystagmus.fundi were not visualized. Vision acuity and fields appear normal. Hearing is normal. Palatal movements are normal. Face symmetric. Tongue midline. Normal strength, tone, reflexes and coordination   Normal sensation. Gait deferred.  ASSESSMENT/PLAN Diana Simmons is a 77 y.o. female with history of CVA, angina on warfarin, CEA, HTN, CAD presenting with R wrist pain with R hand weakness, RLE heaviness, possible vision changes..   Stroke-like episode (not TIA as sx still present, no stroke seen on MRI)  Code Stroke CT head No acute stroke. Small vessel disease. Atrophy. Minimal sinus dz. atherosclerosis  ASPECTS 10.     MRI  No acute stroke  MRA  Unremarkable   Carotid Doppler  Bilateral ICAs 40-59% stenosis. Bilateral vertebral arteries patent with antegrade flow. 2D Echo  EF 55-60%. No source of embolus   LDL 65  HgbA1c 5.4  Warfarin for VTE prophylaxis  warfarin daily prior to admission INR 2.11 on adm, 1.98 now (she is allergic to plavix and aspirin), she was placed on warfarin by her cardiologist 9 years ago d/t constant CP d/t cardiac SVD that resolved on warfarin. Now on No antithrombotic. Ok to resume warfarin and continue at d/c. Discussed with Dr. Evette Doffing and primary teamand recommend discussion with cardiologist Dr. Bettina Gavia to see if she did still needs warfarin can be changed to [facility] or some other antiplatelet agent that she is not allergic to.   Therapy recommendations:  CLR  Disposition:  CLR  Follow up Dr. Nadara Eaton, neurologist  Hypertensiion  SBP > 230 on arrival to her PCP office yesterday  Improved this am down to 103 . BP goal normotensive  Hyperlipidemia  Home meds:  lipitor 48  Ok to resume in hospital  LDL 65, at goal < 70  Continue statin at  discharge  Other Stroke Risk Factors  Advanced age  Former Cigarette smoker, quit 29 yrs ago  Obesity, Body mass index is 30.03 kg/m., recommend weight loss, diet and exercise as appropriate   Hx stroke/TIA  Coronary artery disease  Other Active Problems  Parkinson's like symptoms, resolved. Not felt to be Parkinson's. Followed by [facility], Dr. Nadara Eaton   R wrist pain, R hand weakness, ? Cellulitis - primary team addressing  Orthopnea, wt gain, dry cough  Hospital day # 2  .  Presented with right hand pain and swelling followed by subjective weakness which may likely be from pseudogout in the right wrist. Brain imaging is negative negative for stroke.   It may be worthwhile discussing with cardiologist Dr. Bettina Gavia whether she needs to stay on warfarin as there is no definite indication for it.discussed with patient, Dr. Evette Doffing and internal medicine teaching team and answered questions. Transfer to rehabilitation when bed available. Stroke team will sign off. Kindly call for questions.  Antony Contras, MD Medical Director [facility] Pager: [phone] 10/26/2017 12:40 PM  To contact Stroke Continuity provider, please refer to [email or web address] After hours, contact General Neurology

## 2017-10-26 NOTE — Progress Notes (Signed)
   Subjective: Doing much better. Up walking the halls with PT this morning. Daughter at bedside, says the "strength is back in her voice." Patient says strength is better in her right arm and leg. Right wrist is much better after aspiration and steroid injection. Everyone is on board with going home with [facility].   Objective:  Vital signs in last 24 hours: Vitals:   10/26/17 0553 10/26/17 0554 10/26/17 0736 10/26/17 1223  BP:   (!) 181/74 (!) 145/71  Pulse:   (!) 59 60  Resp: 18  18 18   Temp:   98.2 F (36.8 C) 98.5 F (36.9 C)  TempSrc: Oral  Oral Oral  SpO2:   97% 97%  Weight: 84.4 kg     Height:  5\' 6"  (1.676 m)     Gen: Looks better, up walking the halls with minimal assistance MSK:  Right wrist swelling has almost completely resolved. Still mildly tender and red, but improved. Neuro: speech has returned to normal   Assessment/Plan:  Active Problems:   Neurological deficit present   Hypertensive urgency   Gout of right wrist   Restless leg syndrome   77 y.o. female p/w right arm pain, numbness and weakness which began at 7 am and extended to involve her face and lower extremity. She was found to have a blood pressure 200s and CT head did not show an acute event. She is on coumadin for history of CVA because she is allergic to aspirin and plavix.   #Stroke like syndrome: Mobility and speech continue to improve. Up walking the halls with min assist. Stroke work up remains negative. Encouraging recovery. PT recommending home with HH. We discussed coumadin therapy and recommended stopping it and just taking 81mg  aspirin. Coumadin was initially started 9 years ago for angina. Since that time, her BP has become more labile and is at increased risk of cerebral hemorrhage with HTN and anticoagulation. Also, there is evidence that aspirin therapy is better at preventing strokes and heart attacks. She will f/u with her PCP.   #Hypertension: Improving. Will restart home ACEi   #Right arm  pain and swelling: fluid analysis with monosodium urate crystals consistent with gout. Denies any h/o joint similar swelling. Steroid injection has treated this flare. Informed patient and daughter that they should f/u with her PCP to discuss chronic treatment for gout vs. just treating acute flares.   Dispo: Anticipated discharge in approximately 0-1 day(s).   Isabelle Course, MD 10/26/2017, 2:21 PM Pager: [phone]

## 2017-10-26 NOTE — Progress Notes (Signed)
NURSING PROGRESS NOTE  Diana Simmons [id number] Discharge Data: 10/26/2017 5:07 PM Attending Provider: Axel Filler, * PCP:Van Wendie Chess, MD     Bryan Lemma to be D/C'd Home per MD order.  Discussed with the patient the After Visit Summary and all questions fully answered. All IV's discontinued with no bleeding noted. All belongings returned to patient for patient to take home.   Last Vital Signs:  Blood pressure (!) 158/73, pulse 64, temperature 98.7 F (37.1 C), temperature source Oral, resp. rate 18, height 5\' 6"  (1.676 m), weight 84.4 kg, SpO2 99 %.  Discharge Medication List Allergies as of 10/26/2017      Reactions   Ivp Dye [iodinated Diagnostic Agents] Anaphylaxis   Amlodipine    unknown   Aspirin-dipyridamole Er    Aggrenox   Benadryl [diphenhydramine]    unknown   Dipyridamole    unknown   Lovastatin    unknown   Simvastatin    Sulfa Antibiotics    unknown   Clopidogrel Rash   Rasagiline Nausea Only      Medication List    STOP taking these medications   warfarin 1 MG tablet Commonly known as:  COUMADIN   warfarin 6 MG tablet Commonly known as:  COUMADIN     TAKE these medications   aspirin EC 81 MG tablet Take 1 tablet (81 mg total) by mouth daily.   atorvastatin 40 MG tablet Commonly known as:  LIPITOR Take 40 mg by mouth daily at 6 PM.   carvedilol 3.125 MG tablet Commonly known as:  COREG Take 3.125 mg by mouth 2 (two) times daily.   citalopram 40 MG tablet Commonly known as:  CELEXA Take 40 mg by mouth daily.   fluticasone 50 MCG/ACT nasal spray Commonly known as:  FLONASE Place 1 spray into both nostrils 2 (two) times daily.   lisinopril 10 MG tablet Commonly known as:  PRINIVIL,ZESTRIL Take 10 mg by mouth daily at 12 noon.   nitroGLYCERIN 0.4 MG SL tablet Commonly known as:  NITROSTAT Place 0.4 mg under the tongue every 5 (five) minutes as needed for chest pain.   omeprazole 20 MG capsule Commonly known as:   PRILOSEC Take 20 mg by mouth daily.   potassium chloride SA 20 MEQ tablet Commonly known as:  K-DUR,KLOR-CON Take 20 mEq by mouth daily.   pramipexole 1.5 MG tablet Commonly known as:  MIRAPEX Take 1.5 mg by mouth 2 (two) times daily.   spironolactone 25 MG tablet Commonly known as:  ALDACTONE Take 0.5 tablets (12.5 mg total) by mouth daily.   traMADol 50 MG tablet Commonly known as:  ULTRAM Take 50 mg by mouth every 6 (six) hours as needed.   TYLENOL ARTHRITIS PAIN PO Take 2 tablets by mouth 2 (two) times daily.   Vitamin D (Ergocalciferol) 50000 units Caps capsule Commonly known as:  DRISDOL Take 50,000 Units by mouth every 14 (fourteen) days.

## 2017-10-26 NOTE — Care Management Obs Status (Signed)
[place] NOTIFICATION   Patient Details  Name: Diana Simmons MRN: [id number] Date of Birth: 1940-03-11   Medicare Observation Status Notification Given:  Yes    Pollie Friar, RN 10/26/2017, 3:23 PM

## 2017-10-26 NOTE — Consult Note (Signed)
            [facility] CM Primary Care Navigator  10/26/2017  Diana Simmons [birth year] [id number]    Attempt to see patient at the bedside to identify possible discharge needs but RN is in the room for patient care at this time.  Will attempt to see patient at another time when she is available in the room.     Addendum (10/29/17):  Went back to see patient at the bedside to identify possible discharge needs but she was already discharged. Per chart review, patient went home with home health services.  Per MD note, patient was admitted for evaluation (neurology) right arm pain, numbness and weakness which involves her face and lower extremity. (Stroke like syndrome, Pseudogout, HTN- hypertensive urgency).  Primary care provider's office is listed as providing transition of care (TOC) follow-up.  Primary care provider's office called (Joy) to notify ofpatient's discharge andneed for post hospital follow-up and transition of care (TOC). Notified of patient's health issues needing close follow-up. Was informed that patient has a scheduled appointment for follow-up tomorrow 10/30/17.   Made aware to refer patient to [facility] care management if deemed necessary and appropriate for services.    For additional questions please contact:  Edwena Felty A. Davey Limas, BSN, RN-BC [facility] PRIMARY CARE Navigator Cell: [phone]

## 2017-10-26 NOTE — Progress Notes (Signed)
ANTICOAGULATION CONSULT NOTE - Follow-Up Consult  Pharmacy Consult for Warfarin Indication: Hx CVA  Patient Measurements: Height: 5\' 6"  (167.6 cm) Weight: 186 lb 1.1 oz (84.4 kg) IBW/kg (Calculated) : 59.3  Vital Signs: Temp: 98.2 F (36.8 C) (08/23 0736) Temp Source: Oral (08/23 0736) BP: 181/74 (08/23 0736) Pulse Rate: 59 (08/23 0736)  Labs: Recent Labs    10/24/17 1125 10/24/17 1138 10/25/17 0403 10/26/17 0412  HGB 10.6* 10.2* 10.6*  --   HCT 32.9* 30.0* 32.3*  --   PLT 223  --  212  --   APTT 42*  --   --   --   LABPROT 23.5*  --  22.3* 20.0*  INR 2.11  --  1.98 1.72  CREATININE 0.94 0.90 0.93  --     Estimated Creatinine Clearance: 53.7 mL/min (by C-G formula based on SCr of 0.93 mg/dL).   Assessment: 54 YOF on warfarin PTA for hx recurrent TIAs/CVA who presented on 8/21 with concern for new acute CVA however imaging negative. Admit INR 2.11 on PTA dose of 6.5 mg/day - last dose 8/20. The dose was missed on 8/21. Pharmacy consulted to resume dosing this admission.  INR today remains SUBtherapeutic (INR 1.72 << 1.98, goal of 2-3) likely reflective of missed dose 8/21. No CBC today - no bleeding noted. Will continue with higher dose today to get back to goal range.    Goal of Therapy:  INR 2-3 Monitor platelets by anticoagulation protocol: Yes   Plan:  - Warfarin 9 mg x 1 dose at 1800 today - Will continue to monitor for any signs/symptoms of bleeding and will follow up with PT/INR in the a.m.   Thank you for allowing pharmacy to be a part of this patient's care.  Alycia Rossetti, PharmD, BCPS Clinical Pharmacist Pager: [phone] Clinical phone for 10/26/2017 from 7a-3:30p: [phone] If after 3:30p, please call main pharmacy at: [phone] Please check AMION for all [place] numbers 10/26/2017 8:26 AM

## 2017-10-26 NOTE — Progress Notes (Signed)
Physical Therapy Treatment Patient Details Name: Diana Simmons MRN: [id number] DOB: 1941-02-15 Today's Date: 10/26/2017    History of Present Illness Pt is a 77 y.o. F with significant PMH of Parkinson disease, HTN, OSA (not on CPAP), and h/o TIAs in early 2000s with residual left arm and leg weakness as well as MI. Presenting with right arm pain, weakness, and blurred vision. CT and MRI negative for acute ischemic events. Xray of right wrist negative for acute fracture. s/p lidocaine injection right wrist 8/22.    PT Comments    Patient feeling much better today after injection into right wrist. Progressing well with mobility. Tolerated gait training 150' with SPC without evidence of LOB or knee buckling. Demonstrates 2/4 DOE and requires min guard for safety but much improved from prior session. Due to improvement in functional mobility, disposition updated to home with supervision. Pt may benefit from HHPT. Will follow acutely.     Follow Up Recommendations  Home health PT;Supervision - Intermittent     Equipment Recommendations  None recommended by PT    Recommendations for Other Services       Precautions / Restrictions Precautions Precautions: Fall Precaution Comments: right wrist pain Restrictions Weight Bearing Restrictions: No    Mobility  Bed Mobility Overal bed mobility: Needs Assistance Bed Mobility: Supine to Sit     Supine to sit: Supervision;HOB elevated     General bed mobility comments: supervision with HOB elevated. Able to use RUE to assist.   Transfers Overall transfer level: Needs assistance Equipment used: Straight cane Transfers: Sit to/from Stand Sit to Stand: Min guard         General transfer comment: min guard for safety progressing to supervision. Stood from [employer], from toilet x1. transferred to chair post ambulation.  Ambulation/Gait Ambulation/Gait assistance: Min guard Gait Distance (Feet): 150 Feet Assistive device: Straight  cane Gait Pattern/deviations: Step-through pattern;Decreased stride length Gait velocity: decreased   General Gait Details: Slow, mildly unsteady gait which improved with distance with SPC. No evidence of knee buckling or instability. 2/4 DOE.   Stairs             Wheelchair Mobility    Modified Rankin (Stroke Patients Only) Modified Rankin (Stroke Patients Only) Pre-Morbid Rankin Score: No significant disability Modified Rankin: Moderately severe disability     Balance Overall balance assessment: Needs assistance Sitting-balance support: No upper extremity supported;Feet supported Sitting balance-Leahy Scale: Good     Standing balance support: During functional activity Standing balance-Leahy Scale: Fair Standing balance comment: Able to stand at sink and wash hands reaching outside BOs without assist or LOB.                            Cognition Arousal/Alertness: Awake/alert Behavior During Therapy: WFL for tasks assessed/performed Overall Cognitive Status: Within Functional Limits for tasks assessed                                        Exercises      General Comments        Pertinent Vitals/Pain Pain Assessment: Faces Faces Pain Scale: Hurts little more Pain Location: right wrist with WB Pain Descriptors / Indicators: Discomfort;Grimacing Pain Intervention(s): Monitored during session;Repositioned    Home Living  Prior Function            PT Goals (current goals can now be found in the care plan section) Progress towards PT goals: Progressing toward goals    Frequency    Min 4X/week      PT Plan Discharge plan needs to be updated    Co-evaluation              AM-PAC PT "6 Clicks" Daily Activity  Outcome Measure  Difficulty turning over in bed (including adjusting bedclothes, sheets and blankets)?: None Difficulty moving from lying on back to sitting on the side of the  bed? : None Difficulty sitting down on and standing up from a chair with arms (e.g., wheelchair, bedside commode, etc,.)?: None Help needed moving to and from a bed to chair (including a wheelchair)?: A Little Help needed walking in hospital room?: A Little Help needed climbing 3-5 steps with a railing? : A Lot 6 Click Score: 20    End of Session Equipment Utilized During Treatment: Gait belt Activity Tolerance: Patient tolerated treatment well Patient left: in chair;with call bell/phone within reach;with nursing/sitter in room Nurse Communication: Mobility status PT Visit Diagnosis: Unsteadiness on feet (R26.81);Other abnormalities of gait and mobility (R26.89);Pain;Difficulty in walking, not elsewhere classified (R26.2) Pain - Right/Left: Left Pain - part of body: Arm     Time: 4709-6283 PT Time Calculation (min) (ACUTE ONLY): 20 min  Charges:  $Gait Training: 8-22 mins                     [place], [place], DPT [phone]     Lacie Draft 10/26/2017, 9:31 AM

## 2017-10-26 NOTE — Care Management CC44 (Signed)
Condition Code 44 Documentation Completed  Patient Details  Name: HEER JUSTISS MRN: [id number] Date of Birth: [birth year]   Condition Code 44 given:  Yes Patient signature on Condition Code 44 notice:  Yes Documentation of 2 MD's agreement:  Yes Code 44 added to claim:  Yes    Pollie Friar, RN 10/26/2017, 3:23 PM

## 2017-10-26 NOTE — Progress Notes (Signed)
Rehab Admissions Coordinator Note:  Per PT recommendation, Patient was screened by [name] for appropriateness for an Inpatient Acute Rehab Consult. It appears pt is progressing quickly with therapy (noted Min Guard 150 feet with cane today) and will not need intensive inpatient rehab. Please call if questions.    [name] 10/26/2017, 3:02 PM  I can be reached at [phone].

## 2017-10-28 NOTE — Discharge Summary (Addendum)
Name: Diana Simmons MRN: [id number] DOB: 1940/08/04 77 y.o. PCP: Townsend Roger, MD  Date of Admission: 10/24/2017 11:27 AM Date of Discharge: 10/26/2017 Attending Physician: Dr. Lalla Brothers  Discharge Diagnosis: 1. Stroke like syndrome 2. Pseudogout  Discharge Medications: Allergies as of 10/26/2017      Reactions   Ivp Dye [iodinated Diagnostic Agents] Anaphylaxis   Amlodipine    unknown   Aspirin-dipyridamole Er    Aggrenox   Benadryl [diphenhydramine]    unknown   Dipyridamole    unknown   Lovastatin    unknown   Simvastatin    Sulfa Antibiotics    unknown   Clopidogrel Rash   Rasagiline Nausea Only      Medication List    STOP taking these medications   warfarin 1 MG tablet Commonly known as:  COUMADIN   warfarin 6 MG tablet Commonly known as:  COUMADIN     TAKE these medications   aspirin EC 81 MG tablet Take 1 tablet (81 mg total) by mouth daily.   atorvastatin 40 MG tablet Commonly known as:  LIPITOR Take 40 mg by mouth daily at 6 PM.   carvedilol 3.125 MG tablet Commonly known as:  COREG Take 3.125 mg by mouth 2 (two) times daily.   citalopram 40 MG tablet Commonly known as:  CELEXA Take 40 mg by mouth daily.   fluticasone 50 MCG/ACT nasal spray Commonly known as:  FLONASE Place 1 spray into both nostrils 2 (two) times daily.   lisinopril 10 MG tablet Commonly known as:  PRINIVIL,ZESTRIL Take 10 mg by mouth daily at 12 noon.   nitroGLYCERIN 0.4 MG SL tablet Commonly known as:  NITROSTAT Place 0.4 mg under the tongue every 5 (five) minutes as needed for chest pain.   omeprazole 20 MG capsule Commonly known as:  PRILOSEC Take 20 mg by mouth daily.   potassium chloride SA 20 MEQ tablet Commonly known as:  K-DUR,KLOR-CON Take 20 mEq by mouth daily.   pramipexole 1.5 MG tablet Commonly known as:  MIRAPEX Take 1.5 mg by mouth 2 (two) times daily.   spironolactone 25 MG tablet Commonly known as:  ALDACTONE Take 0.5 tablets  (12.5 mg total) by mouth daily.   traMADol 50 MG tablet Commonly known as:  ULTRAM Take 50 mg by mouth every 6 (six) hours as needed.   TYLENOL ARTHRITIS PAIN PO Take 2 tablets by mouth 2 (two) times daily.   Vitamin D (Ergocalciferol) 50000 units Caps capsule Commonly known as:  DRISDOL Take 50,000 Units by mouth every 14 (fourteen) days.       Disposition and follow-up:   Ms.Diana Simmons was discharged from [facility] in Good condition.  At the hospital follow up visit please address:  1.  HTN: We did not make any changes to her home medication regiment. She did require IV hydralazine during admission for SBPs >200.   Stroke like syndrome: Please assess neurologic exam. Her focal neuro symptoms (right facial droop, right arm/leg weakness, diffuse decreased sensation on the right side) had resolved by time of discharge. She was just feeling a little weak. We discharged her with [place].   Pseudogout: right wrist flare. Treated with steroid injection. Please discuss prophylactic treatment vs. Waiting to treat future flares.  Anticoagulation: We took her off warfarin and started a daily baby aspirin. From discussing with her, she does not remember her allergic reaction to aspirin. She was started on warfarin at least 9 years ago  for treatment of angina and CVA secondary prevention. Since that time, her BP has become more labile and being on warfarin with uncontrolled HTN increases her risk of cerebral hemorrhage. We also discussed that aspirin is more effective at preventing heart attacks and strokes. For these reasons, the patient, family, and medical team agreed that discontinuing warfarin would be the best decision at this time.   2.  Labs / imaging needed at time of follow-up: none  3.  Pending labs/ test needing follow-up: none  Follow-up Appointments: She will schedule appt with PCP  Hospital Course by problem list: 77 y.o. female p/w  She was found to have a  blood pressure 200s and CT head did not show an acute event. She is on coumadin for history of CVA because she is allergic to aspirin and plavix.   #Stroke like syndrome: She presented with right arm pain, numbness and weakness which began at 7 am and extended to involve her face and lower extremity. Code stroke was called on arrival. Evaluated by neurology. Symptoms included, slowed speech, right arm/leg weakness, diffuse decreases sensation on right side, and blurry vision. CT head was negative. MRI was negative. MRA had too much motion artifact. Carotid dopplers and echo were unremarkable. Her symptoms improved overnight. She worked with PT and was able to discharge to home with home health.  We discussed coumadin therapy with patient and daughter and recommended stopping it and just taking 81mg  aspirin. Coumadin was initially started 9 years ago for angina and CVA prophylaxis. Since that time, her BP has become more labile and is at increased risk of cerebral hemorrhage with HTN and anticoagulation. Also, there is evidence that aspirin therapy is better at preventing strokes and heart attacks. We discontinued her warfarin and started her one Aspirin 81mg  daily. She will follow up with her PCP.   #Hypertension: Treated with IV hydralazine. Discharged on home medications  #Right arm pain and wrist swelling: Wrist join aspiration and steroid injection performed. Fluid analysis revealed calcium pyrophosphate crystals consistent with pseudogout. Denies any h/o joint similar swelling. Steroid injection has treated this flare. Informed patient and daughter that they should f/u with her PCP to discuss chronic treatment for gout vs. just treating acute flares.   Discharge Vitals:   BP (!) 158/73 (BP Location: Right Arm)   Pulse 64   Temp 98.7 F (37.1 C) (Oral)   Resp 18   Ht 5\' 6"  (1.676 m)   Wt 84.4 kg   SpO2 99%   BMI 30.03 kg/m   Pertinent Labs, Studies, and Procedures:  CT head, MRI/MRA brain  negative for acute stroke.  Carotid dopplers: 40-59% stenosis bilaterally ICAs  Echo: EF 55-60%, no cardiac source of emboli, mild concentric hypertrophy, no wall motion abnormalities, mild AR, trivial MR.  Right arm DVT study: negative  Right wrist aspiration showed calcium pyrophosphate crystals  Discharge Instructions: Discharge Instructions    Diet - low sodium heart healthy   Complete by:  As directed    Discharge instructions   Complete by:  As directed    Ms. Diana Simmons, Diana Simmons were admitted to the hospital for concerns of a stroke. However, your stroke work up was negative and thankfully, your symptoms resolved. You could have had a "mini-stroke" or TIA or your symptoms could have been related to high blood pressure. Nonetheless, I'm glad you are better! We do recommend stopping warfarin and taking a baby aspirin everyday. Taking warfarin with a very high blood pressure can  increase your risk for a brain bleed and aspirin is better at preventing strokes and heart attacks. For these reasons, we will it is best for you to stop warfarin. I will send our summary of your hospital stay to Dr. Nona Dell and Dr. Bettina Gavia.   We were also able to treat your right wrist and diagnose it as gout. You can talk with your primary care doctor about treatment options. You can either wait and see if you get another gout flare and treat them as they come or there's a medicine you can take every day to prevent another gout attack. If you have any questions, please don't hesitate to call our clinic at [phone].   It was a pleasure taking care of you!  -Dr. Donne Hazel   Increase activity slowly   Complete by:  As directed       Signed: Isabelle Course, MD 10/28/2017, 7:27 AM   Pager: [phone]

## 2017-10-30 DIAGNOSIS — I679 Cerebrovascular disease, unspecified: Secondary | ICD-10-CM | POA: Diagnosis not present

## 2017-10-30 DIAGNOSIS — R269 Unspecified abnormalities of gait and mobility: Secondary | ICD-10-CM | POA: Diagnosis not present

## 2017-10-30 DIAGNOSIS — R350 Frequency of micturition: Secondary | ICD-10-CM | POA: Diagnosis not present

## 2017-10-30 DIAGNOSIS — N3281 Overactive bladder: Secondary | ICD-10-CM | POA: Diagnosis not present

## 2017-11-14 DIAGNOSIS — I10 Essential (primary) hypertension: Secondary | ICD-10-CM | POA: Diagnosis not present

## 2017-12-05 DIAGNOSIS — Z1389 Encounter for screening for other disorder: Secondary | ICD-10-CM | POA: Diagnosis not present

## 2017-12-05 DIAGNOSIS — I10 Essential (primary) hypertension: Secondary | ICD-10-CM | POA: Diagnosis not present

## 2017-12-05 DIAGNOSIS — Z23 Encounter for immunization: Secondary | ICD-10-CM | POA: Diagnosis not present

## 2017-12-05 DIAGNOSIS — E7800 Pure hypercholesterolemia, unspecified: Secondary | ICD-10-CM | POA: Diagnosis not present

## 2017-12-05 DIAGNOSIS — I2510 Atherosclerotic heart disease of native coronary artery without angina pectoris: Secondary | ICD-10-CM | POA: Diagnosis not present

## 2017-12-05 DIAGNOSIS — I739 Peripheral vascular disease, unspecified: Secondary | ICD-10-CM | POA: Diagnosis not present

## 2017-12-05 DIAGNOSIS — Z0000 Encounter for general adult medical examination without abnormal findings: Secondary | ICD-10-CM | POA: Diagnosis not present

## 2017-12-05 DIAGNOSIS — I679 Cerebrovascular disease, unspecified: Secondary | ICD-10-CM | POA: Diagnosis not present

## 2018-03-11 DIAGNOSIS — I10 Essential (primary) hypertension: Secondary | ICD-10-CM | POA: Diagnosis not present

## 2018-03-11 NOTE — Progress Notes (Signed)
Cardiology Office Note:    Date:  03/12/2018   ID:  Diana Simmons, DOB Jan 17, 1941, MRN [id number]  PCP:  Townsend Roger, MD  Cardiologist:  Shirlee More, MD    Referring MD: Townsend Roger, MD    ASSESSMENT:    1. Coronary artery disease involving native coronary artery of native heart with angina pectoris ([place])   2. Hypertensive heart disease without heart failure   3. Pure hypercholesterolemia   4. Cerebrovascular accident (CVA), unspecified mechanism ([place])   5. SOB (shortness of breath)    PLAN:    In order of problems listed above:  1. Stable New [place] Heart Association class I continue medical treatment now is taking aspirin warfarin discontinued along with high intensity statin.  At this time I would not pursue a coronary ischemia evaluation 2. Poorly controlled since her hospitalization weight is up 10 pounds she was withdrawing from her beta-blocker I have asked her to start a low-dose of loop diuretic as she is short of breath weight is increased his peripheral edema check proBNP level.  I suspect her blood pressure will come in to target 3. Stable continue high intensity statin 4. Stable I reviewed records from [facility] she had strokelike symptoms without stroke and I suspect in retrospect related to her local anesthesia with epinephrine at the time of dental procedures causing hypertensive crisis.  I gave her handwritten note not to have epinephrine and local anesthesia in the future 5. Appears to have heart failure preserved ejection fraction with edema shortness of breath orthopnea weight gain and will start her on low-dose loop diuretic and recheck labs BMP and proBNP 1 week     Next appointment: 1 month   Medication Adjustments/Labs and Tests Ordered: Current medicines are reviewed at length with the patient today.  Concerns regarding medicines are outlined above.  No orders of the defined types were placed in this encounter.  No orders of the defined  types were placed in this encounter.   Chief Complaint  Patient presents with  . Follow-up  . Hypotension  . Coronary Artery Disease    History of Present Illness:    Diana Simmons is a 78 y.o. female with a hx of  STEMI, CAD, Dyslipidemia, Peripheral Vascular Disease and stroke with CEA  last seen 06/13/17. Compliance with diet, lifestyle and medications: Yes  Is a complex visit she had a recent admission with strokelike syndrome without stroke and no evidence of cardioembolism.  Medications were changed withdrawal from warfarin placed on aspirin beta-blocker stopped now blood pressures are running 1 60-1 80 range at home despite increased dose of ACE inhibitor.  She has noticed a weight gain of 10 pounds edema shortness of breath orthopnea and appears to have mildly decompensated heart failure I will place her on low-dose of the loop diuretic should help with hypertension and recheck renal function and proBNP.  She has had no angina palpitation or syncope.  She is also under intense stress with her husband's limitations medical illness and relocation to [facility] Past Medical History:  Diagnosis Date  . Cerebrovascular accident (CVA) ([place]) 02/15/2016  . Chronic anticoagulation 11/22/2014  . Coronary artery disease   . Coronary artery disease involving native coronary artery of native heart with angina pectoris ([place]) 11/22/2014   Overview:  Diffuse disease not amenable to PCI  . Falls, sequela 02/15/2016  . H/O carotid endarterectomy 02/15/2016  . Hypertension   . Lack of coordination 09/24/2012  .  Parkinson's disease ([place]) 09/24/2012  . Stroke ([place])   . Tremor 02/15/2016    Past Surgical History:  Procedure Laterality Date  . ABDOMINAL AORTIC ANEURYSM REPAIR    . ABDOMINAL HYSTERECTOMY    . CARDIAC SURGERY    . CAROTID ENDARTERECTOMY    . CHOLECYSTECTOMY    . HERNIA REPAIR      Current Medications: Current Meds  Medication Sig  . Acetaminophen (TYLENOL ARTHRITIS  PAIN PO) Take 2 tablets by mouth 2 (two) times daily.  Marland Kitchen aspirin EC 81 MG tablet Take 1 tablet (81 mg total) by mouth daily.  Marland Kitchen atorvastatin (LIPITOR) 40 MG tablet Take 40 mg by mouth daily at 6 PM.   . citalopram (CELEXA) 40 MG tablet Take 40 mg by mouth daily.  . fluticasone (FLONASE) 50 MCG/ACT nasal spray Place 1 spray into both nostrils 2 (two) times daily.  Marland Kitchen lisinopril (PRINIVIL,ZESTRIL) 10 MG tablet Take 10 mg by mouth daily at 12 noon.  . nitroGLYCERIN (NITROSTAT) 0.4 MG SL tablet Place 0.4 mg under the tongue every 5 (five) minutes as needed for chest pain.  Marland Kitchen omeprazole (PRILOSEC) 20 MG capsule Take 20 mg by mouth daily.  . potassium chloride SA (K-DUR,KLOR-CON) 20 MEQ tablet Take 20 mEq by mouth daily.  . pramipexole (MIRAPEX) 1.5 MG tablet Take 1.5 mg by mouth 2 (two) times daily.   Marland Kitchen spironolactone (ALDACTONE) 25 MG tablet Take 0.5 tablets (12.5 mg total) by mouth daily.  . traMADol (ULTRAM) 50 MG tablet Take 50 mg by mouth every 6 (six) hours as needed.  . Vitamin D, Ergocalciferol, (DRISDOL) 50000 units CAPS capsule Take 50,000 Units by mouth every 14 (fourteen) days.     Allergies:   Ivp dye [iodinated diagnostic agents]; Benadryl [diphenhydramine]; Dipyridamole; Lovastatin; Simvastatin; Sulfa antibiotics; Clopidogrel; and Rasagiline   Social History   Socioeconomic History  . Marital status: Married    Spouse name: Not on file  . Number of children: Not on file  . Years of education: Not on file  . Highest education level: Not on file  Occupational History  . Not on file  Social Needs  . Financial resource strain: Not on file  . Food insecurity:    Worry: Not on file    Inability: Not on file  . Transportation needs:    Medical: Not on file    Non-medical: Not on file  Tobacco Use  . Smoking status: Former Smoker    Last attempt to quit: 1990    Years since quitting: 30.0  . Smokeless tobacco: Never Used  Substance and Sexual Activity  . Alcohol use: No     Frequency: Never  . Drug use: No  . Sexual activity: Not Currently  Lifestyle  . Physical activity:    Days per week: Not on file    Minutes per session: Not on file  . Stress: Not on file  Relationships  . Social connections:    Talks on phone: More than three times a week    Gets together: More than three times a week    Attends religious service: More than 4 times per year    Active member of club or organization: Yes    Attends meetings of clubs or organizations: More than 4 times per year    Relationship status: Married  Other Topics Concern  . Not on file  Social History Narrative  . Not on file     Family History: The patient's family history includes Alzheimer's disease  in her sister; Bladder Cancer in her brother and brother; CAD in her sister; Congestive Heart Failure in her father; Heart Problems in her sister; Heart attack in her mother. ROS:   Please see the history of present illness.    All other systems reviewed and are negative.  EKGs/Labs/Other Studies Reviewed:    The following studies were reviewed today:  Recent Labs: 05/23/2017: NT-Pro BNP 748 10/24/2017: ALT 16 10/25/2017: BUN 15; Creatinine, Ser 0.93; Hemoglobin 10.6; Platelets 212; Potassium 3.4; Sodium 139  Recent Lipid Panel    Component Value Date/Time   CHOL 115 10/25/2017 0403   TRIG 71 10/25/2017 0403   HDL 36 (L) 10/25/2017 0403   CHOLHDL 3.2 10/25/2017 0403   VLDL 14 10/25/2017 0403   LDLCALC 65 10/25/2017 0403    Physical Exam:    VS:  BP (!) 168/86 (BP Location: Right Arm, Patient Position: Sitting, Cuff Size: Large)   Pulse 71   Ht 5\' 2"  (1.575 m)   Wt 190 lb 4 oz (86.3 kg)   SpO2 99%   BMI 34.80 kg/m     Wt Readings from Last 3 Encounters:  03/12/18 190 lb 4 oz (86.3 kg)  10/26/17 186 lb 1.1 oz (84.4 kg)  06/13/17 180 lb (81.6 kg)     GEN:  Well nourished, well developed in no acute distress HEENT: Normal NECK: No JVD; No carotid bruits LYMPHATICS: No  lymphadenopathy CARDIAC: RRR, no murmurs, rubs, gallops RESPIRATORY:  Clear to auscultation without rales, wheezing or rhonchi  ABDOMEN: Soft, non-tender, non-distended MUSCULOSKELETAL:  No edema; No deformity  SKIN: Warm and dry NEUROLOGIC:  Alert and oriented x 3 PSYCHIATRIC:  Normal affect    Signed, Shirlee More, MD  03/12/2018 11:02 AM    [place]

## 2018-03-12 ENCOUNTER — Ambulatory Visit (INDEPENDENT_AMBULATORY_CARE_PROVIDER_SITE_OTHER): Payer: Medicare Other | Admitting: Cardiology

## 2018-03-12 ENCOUNTER — Encounter: Payer: Self-pay | Admitting: Cardiology

## 2018-03-12 VITALS — BP 168/86 | HR 71 | Ht 62.0 in | Wt 190.2 lb

## 2018-03-12 DIAGNOSIS — I639 Cerebral infarction, unspecified: Secondary | ICD-10-CM

## 2018-03-12 DIAGNOSIS — R0602 Shortness of breath: Secondary | ICD-10-CM | POA: Insufficient documentation

## 2018-03-12 DIAGNOSIS — I25119 Atherosclerotic heart disease of native coronary artery with unspecified angina pectoris: Secondary | ICD-10-CM | POA: Diagnosis not present

## 2018-03-12 DIAGNOSIS — I119 Hypertensive heart disease without heart failure: Secondary | ICD-10-CM | POA: Diagnosis not present

## 2018-03-12 DIAGNOSIS — E7800 Pure hypercholesterolemia, unspecified: Secondary | ICD-10-CM

## 2018-03-12 MED ORDER — FUROSEMIDE 20 MG PO TABS: 20.0000 mg | ORAL_TABLET | Freq: Every day | ORAL | 1 refills | Status: DC

## 2018-03-12 NOTE — Patient Instructions (Addendum)
Medication Instructions:  Your physician has recommended you make the following change in your medication:   START furosemide (lasix) 20 mg: Take 1 tablet daily   If you need a refill on your cardiac medications before your next appointment, please call your pharmacy.   Lab work: Your physician recommends that you return for lab work in 1 week: BMP, ProBNP. Please return to our office for labs, no appointment needed. You do not need to fast beforehand.   If you have labs (blood work) drawn today and your tests are completely normal, you will receive your results only by: Marland Kitchen MyChart Message (if you have MyChart) OR . A paper copy in the mail If you have any lab test that is abnormal or we need to change your treatment, we will call you to review the results.  Testing/Procedures: None  Follow-Up: At [facility], you and your health needs are our priority.  As part of our continuing mission to provide you with exceptional heart care, we have created designated Provider Care Teams.  These Care Teams include your primary Cardiologist (physician) and Advanced Practice Providers (APPs -  Physician Assistants and Nurse Practitioners) who all work together to provide you with the care you need, when you need it. . You will need a follow up appointment in 1 months.  Please call our office 2 months in advance to schedule this appointment.    Any Other Special Instructions Will Be Listed Below (If Applicable). **Please check your blood pressure twice daily and the same time every day and record these readings. Bring this log to your follow up appointment for Dr. Bettina Gavia to review.     DASH diet: Healthy eating to lower your blood pressure The DASH diet emphasizes portion size, eating a variety of foods and getting the right amount of nutrients. Discover how DASH can improve your health and lower your blood pressure. By [facility] Staff  DASH stands for Dietary Approaches to Stop Hypertension. The  DASH diet is a lifelong approach to healthy eating that's designed to help treat or prevent high blood pressure (hypertension). The DASH diet encourages you to reduce the sodium in your diet and eat a variety of foods rich in nutrients that help lower blood pressure, such as potassium, calcium and magnesium. By following the DASH diet, you may be able to reduce your blood pressure by a few points in just two weeks. Over time, your systolic blood pressure could drop by eight to 14 points, which can make a significant difference in your health risks. Because the DASH diet is a healthy way of eating, it offers health benefits besides just lowering blood pressure. The DASH diet is also in line with dietary recommendations to prevent osteoporosis, cancer, heart disease, stroke and diabetes. DASH diet: Sodium levels The DASH diet emphasizes vegetables, fruits and low-fat dairy foods - and moderate amounts of whole grains, fish, poultry and nuts. In addition to the standard DASH diet, there is also a lower sodium version of the diet. You can choose the version of the diet that meets your health needs: Standard DASH diet. You can consume up to 2,300 milligrams (mg) of sodium a day.  Lower sodium DASH diet. You can consume up to 1,500 mg of sodium a day. Both versions of the DASH diet aim to reduce the amount of sodium in your diet compared with what you might get in a typical American diet, which can amount to a whopping 3,400 mg of sodium a  day or more. The standard DASH diet meets the recommendation from the Dietary Guidelines for Americans to keep daily sodium intake to less than 2,300 mg a day. The American Heart Association recommends 1,500 mg a day of sodium as an upper limit for all adults. If you aren't sure what sodium level is right for you, talk to your doctor. DASH diet: What to eat Both versions of the DASH diet include lots of whole grains, fruits, vegetables and low-fat dairy products. The DASH  diet also includes some fish, poultry and legumes, and encourages a small amount of nuts and seeds a few times a week.  You can eat red meat, sweets and fats in small amounts. The DASH diet is low in saturated fat, cholesterol and total fat. Here's a look at the recommended servings from each food group for the 2,000-calorie-a-day DASH diet. Grains: 6 to 8 servings a day Grains include bread, cereal, rice and pasta. Examples of one serving of grains include 1 slice whole-wheat bread, 1 ounce dry cereal, or 1/2 cup cooked cereal, rice or pasta. Focus on whole grains because they have more fiber and nutrients than do refined grains. For instance, use brown rice instead of white rice, whole-wheat pasta instead of regular pasta and whole-grain bread instead of white bread. Look for products labeled "100 percent whole grain" or "100 percent whole wheat."  Grains are naturally low in fat. Keep them this way by avoiding butter, cream and cheese sauces. Vegetables: 4 to 5 servings a day Tomatoes, carrots, broccoli, sweet potatoes, greens and other vegetables are full of fiber, vitamins, and such minerals as potassium and magnesium. Examples of one serving include 1 cup raw leafy green vegetables or 1/2 cup cut-up raw or cooked vegetables. Don't think of vegetables only as side dishes - a hearty blend of vegetables served over brown rice or whole-wheat noodles can serve as the main dish for a meal.  Fresh and frozen vegetables are both good choices. When buying frozen and canned vegetables, choose those labeled as low sodium or without added salt.  To increase the number of servings you fit in daily, be creative. In a stir-fry, for instance, cut the amount of meat in half and double up on the vegetables. Fruits: 4 to 5 servings a day Many fruits need little preparation to become a healthy part of a meal or snack. Like vegetables, they're packed with fiber, potassium and magnesium and are typically low in fat -  coconuts are an exception. Examples of one serving include one medium fruit, 1/2 cup fresh, frozen or canned fruit, or 4 ounces of juice. Have a piece of fruit with meals and one as a snack, then round out your day with a dessert of fresh fruits topped with a dollop of low-fat yogurt.  Leave on edible peels whenever possible. The peels of apples, pears and most fruits with pits add interesting texture to recipes and contain healthy nutrients and fiber.  Remember that citrus fruits and juices, such as grapefruit, can interact with certain medications, so check with your doctor or pharmacist to see if they're OK for you.  If you choose canned fruit or juice, make sure no sugar is added. Dairy: 2 to 3 servings a day Milk, yogurt, cheese and other dairy products are major sources of calcium, vitamin D and protein. But the key is to make sure that you choose dairy products that are low fat or fat-free because otherwise they can be a major source of  fat - and most of it is saturated. Examples of one serving include 1 cup skim or 1 percent milk, 1 cup low fat yogurt, or 1 1/2 ounces part-skim cheese. Low-fat or fat-free frozen yogurt can help you boost the amount of dairy products you eat while offering a sweet treat. Add fruit for a healthy twist.  If you have trouble digesting dairy products, choose lactose-free products or consider taking an over-the-counter product that contains the enzyme lactase, which can reduce or prevent the symptoms of lactose intolerance.  Go easy on regular and even fat-free cheeses because they are typically high in sodium. Lean meat, poultry and fish: 6 servings or fewer a day Meat can be a rich source of protein, B vitamins, iron and zinc. Choose lean varieties and aim for no more than 6 ounces a day. Cutting back on your meat portion will allow room for more vegetables. Trim away skin and fat from poultry and meat and then bake, broil, grill or roast instead of frying in fat.   Eat heart-healthy fish, such as salmon, herring and tuna. These types of fish are high in omega-3 fatty acids, which can help lower your total cholesterol. Nuts, seeds and legumes: 4 to 5 servings a week Almonds, sunflower seeds, kidney beans, peas, lentils and other foods in this family are good sources of magnesium, potassium and protein. They're also full of fiber and phytochemicals, which are plant compounds that may protect against some cancers and cardiovascular disease. Serving sizes are small and are intended to be consumed only a few times a week because these foods are high in calories. Examples of one serving include 1/3 cup nuts, 2 tablespoons seeds, or 1/2 cup cooked beans or peas.  Nuts sometimes get a bad rap because of their fat content, but they contain healthy types of fat - monounsaturated fat and omega-3 fatty acids. They're high in calories, however, so eat them in moderation. Try adding them to stir-fries, salads or cereals.  Soybean-based products, such as tofu and tempeh, can be a good alternative to meat because they contain all of the amino acids your body needs to make a complete protein, just like meat. Fats and oils: 2 to 3 servings a day Fat helps your body absorb essential vitamins and helps your body's immune system. But too much fat increases your risk of heart disease, diabetes and obesity. The DASH diet strives for a healthy balance by limiting total fat to less than 30 percent of daily calories from fat, with a focus on the healthier monounsaturated fats. Examples of one serving include 1 teaspoon soft margarine, 1 tablespoon mayonnaise or 2 tablespoons salad dressing. Saturated fat and trans fat are the main dietary culprits in increasing your risk of coronary artery disease. DASH helps keep your daily saturated fat to less than 6 percent of your total calories by limiting use of meat, butter, cheese, whole milk, cream and eggs in your diet, along with foods made from  lard, solid shortenings, and palm and coconut oils.  Avoid trans fat, commonly found in such processed foods as crackers, baked goods and fried items.  Read food labels on margarine and salad dressing so that you can choose those that are lowest in saturated fat and free of trans fat. Sweets: 5 servings or fewer a week You don't have to banish sweets entirely while following the DASH diet - just go easy on them. Examples of one serving include 1 tablespoon sugar, jelly or jam, 1/2 cup  sorbet, or 1 cup lemonade. When you eat sweets, choose those that are fat-free or low-fat, such as sorbets, fruit ices, jelly beans, hard candy, graham crackers or low-fat cookies.  Artificial sweeteners such as aspartame (NutraSweet, Equal) and sucralose (Splenda) may help satisfy your sweet tooth while sparing the sugar. But remember that you still must use them sensibly. It's OK to swap a diet cola for a regular cola, but not in place of a more nutritious beverage such as low-fat milk or even plain water.  Cut back on added sugar, which has no nutritional value but can pack on calories. DASH diet: Alcohol and caffeine Drinking too much alcohol can increase blood pressure. The Dietary Guidelines for Americans recommends that men limit alcohol to no more than two drinks a day and women to one or less. The DASH diet doesn't address caffeine consumption. The influence of caffeine on blood pressure remains unclear. But caffeine can cause your blood pressure to rise at least temporarily. If you already have high blood pressure or if you think caffeine is affecting your blood pressure, talk to your doctor about your caffeine consumption. DASH diet and weight loss While the DASH diet is not a weight-loss program, you may indeed lose unwanted pounds because it can help guide you toward healthier food choices. The DASH diet generally includes about 2,000 calories a day. If you're trying to lose weight, you may need to eat fewer  calories. You may also need to adjust your serving goals based on your individual circumstances - something your health care team can help you decide. Tips to cut back on sodium The foods at the core of the DASH diet are naturally low in sodium. So just by following the DASH diet, you're likely to reduce your sodium intake. You also reduce sodium further by: Using sodium-free spices or flavorings with your food instead of salt  Not adding salt when cooking rice, pasta or hot cereal  Rinsing canned foods to remove some of the sodium  Buying foods labeled "no salt added," "sodium-free," "low sodium" or "very low sodium" One teaspoon of table salt has 2,325 mg of sodium. When you read food labels, you may be surprised at just how much sodium some processed foods contain. Even low-fat soups, canned vegetables, ready-to-eat cereals and sliced [place] from the local deli - foods you may have considered healthy - often have lots of sodium. You may notice a difference in taste when you choose low-sodium food and beverages. If things seem too bland, gradually introduce low-sodium foods and cut back on table salt until you reach your sodium goal. That'll give your palate time to adjust. Using salt-free seasoning blends or herbs and spices may also ease the transition. It can take several weeks for your taste buds to get used to less salty foods. Putting the pieces of the DASH diet together Try these strategies to get started on the DASH diet:  Change gradually. If you now eat only one or two servings of fruits or vegetables a day, try to add a serving at lunch and one at dinner. Rather than switching to all whole grains, start by making one or two of your grain servings whole grains. Increasing fruits, vegetables and whole grains gradually can also help prevent bloating or diarrhea that may occur if you aren't used to eating a diet with lots of fiber. You can also try over-the-counter products to help reduce gas  from beans and vegetables.  Reward successes and forgive slip-ups.  Reward yourself with a nonfood treat for your accomplishments - rent a movie, purchase a book or get together with a friend. Everyone slips, especially when learning something new. Remember that changing your lifestyle is a long-term process. Find out what triggered your setback and then just pick up where you left off with the DASH diet.  Add physical activity. To boost your blood pressure lowering efforts even more, consider increasing your physical activity in addition to following the DASH diet. Combining both the DASH diet and physical activity makes it more likely that you'll reduce your blood pressure.  Get support if you need it. If you're having trouble sticking to your diet, talk to your doctor or dietitian about it. You might get some tips that will help you stick to the DASH diet. Remember, healthy eating isn't an all-or-nothing proposition. What's most important is that, on average, you eat healthier foods with plenty of variety - both to keep your diet nutritious and to avoid boredom or extremes. And with the DASH diet, you can have both.   [employer]  Tips to measure your blood pressure correctly  To determine whether you have hypertension, a medical professional will take a blood pressure reading. How you prepare for the test, the position of your arm, and other factors can change a blood pressure reading by 10% or more. That could be enough to hide high blood pressure, start you on a drug you don't really need, or lead your doctor to incorrectly adjust your medications. National and international guidelines offer specific instructions for measuring blood pressure. If a doctor, nurse, or medical assistant isn't doing it right, don't hesitate to ask him or her to get with the guidelines. Here's what you can do to ensure a correct reading: . Don't drink a caffeinated beverage or smoke during the 30 minutes before the  test. . Sit quietly for five minutes before the test begins. . During the measurement, sit in a chair with your feet on the floor and your arm supported so your elbow is at about heart level. . The inflatable part of the cuff should completely cover at least 80% of your upper arm, and the cuff should be placed on bare skin, not over a shirt. . Don't talk during the measurement. . Have your blood pressure measured twice, with a brief break in between. If the readings are different by 5 points or more, have it done a third time. There are times to break these rules. If you sometimes feel lightheaded when getting out of bed in the morning or when you stand after sitting, you should have your blood pressure checked while seated and then while standing to see if it falls from one position to the next. Because blood pressure varies throughout the day, your doctor will rarely diagnose hypertension on the basis of a single reading. Instead, he or she will want to confirm the measurements on at least two occasions, usually within a few weeks of one another. The exception to this rule is if you have a blood pressure reading of 180/110 mm Hg or higher. A result this high usually calls for prompt treatment. It's also a good idea to have your blood pressure measured in both arms at least once, since the reading in one arm (usually the right) may be higher than that in the left. A 2014 study in The American Journal of Medicine of nearly 3,400 people found average arm- to-arm differences in systolic blood pressure of about 5  points. The higher number should be used to make treatment decisions. In 2017, new guidelines from the [place], the [employer] of [employer], and nine other health organizations lowered the diagnosis of high blood pressure to 130/80 mm Hg or higher for all adults. The guidelines also redefined the various blood pressure categories to now include normal, elevated, Stage 1  hypertension, Stage 2 hypertension, and hypertensive crisis (see "Blood pressure categories"). Blood pressure categories  Blood pressure category SYSTOLIC (upper number)  DIASTOLIC (lower number)  Normal Less than 120 mm Hg and Less than 80 mm Hg  Elevated 120-129 mm Hg and Less than 80 mm Hg  High blood pressure: Stage 1 hypertension 130-139 mm Hg or 80-89 mm Hg  High blood pressure: Stage 2 hypertension 140 mm Hg or higher or 90 mm Hg or higher  Hypertensive crisis (consult your doctor immediately) Higher than 180 mm Hg and/or Higher than 120 mm Hg  Source: American Heart Association and American Stroke Association. For more on getting your blood pressure under control, buy Controlling Your Blood Pressure, a Special Health Report from [facility].    Furosemide tablets What is this medicine? FUROSEMIDE (fyoor [place] se mide) is a diuretic. It helps you make more urine and to lose salt and excess water from your body. This medicine is used to treat high blood pressure, and edema or swelling from heart, kidney, or liver disease. This medicine may be used for other purposes; ask your health care provider or pharmacist if you have questions. COMMON BRAND NAME(S): Active-Medicated Specimen Kit, Delone, Diuscreen, Lasix, RX Specimen Collection Kit, Specimen Collection Kit, URINX Medicated Specimen Collection What should I tell my health care provider before I take this medicine? They need to know if you have any of these conditions: -abnormal blood electrolytes -diarrhea or vomiting -gout -heart disease -kidney disease, small amounts of urine, or difficulty passing urine -liver disease -thyroid disease -an unusual or allergic reaction to furosemide, sulfa drugs, other medicines, foods, dyes, or preservatives -pregnant or trying to get pregnant -breast-feeding How should I use this medicine? Take this medicine by mouth with a glass of water. Follow the directions on the  prescription label. You may take this medicine with or without food. If it upsets your stomach, take it with food or milk. Do not take your medicine more often than directed. Remember that you will need to pass more urine after taking this medicine. Do not take your medicine at a time of day that will cause you problems. Do not take at bedtime. Talk to your pediatrician regarding the use of this medicine in children. While this drug may be prescribed for selected conditions, precautions do apply. Overdosage: If you think you have taken too much of this medicine contact a poison control center or emergency room at once. NOTE: This medicine is only for you. Do not share this medicine with others. What if I miss a dose? If you miss a dose, take it as soon as you can. If it is almost time for your next dose, take only that dose. Do not take double or extra doses. What may interact with this medicine? -aspirin and aspirin-like medicines -certain antibiotics -chloral hydrate -cisplatin -cyclosporine -digoxin -diuretics -laxatives -lithium -medicines for blood pressure -medicines that relax muscles for surgery -methotrexate -NSAIDs, medicines for pain and inflammation like ibuprofen, naproxen, or indomethacin -phenytoin -steroid medicines like prednisone or cortisone -sucralfate -thyroid hormones This list may not describe all possible interactions. Give your health  care provider a list of all the medicines, herbs, non-prescription drugs, or dietary supplements you use. Also tell them if you smoke, drink alcohol, or use illegal drugs. Some items may interact with your medicine. What should I watch for while using this medicine? Visit your doctor or health care professional for regular checks on your progress. Check your blood pressure regularly. Ask your doctor or health care professional what your blood pressure should be, and when you should contact him or her. If you are a diabetic, check your  blood sugar as directed. You may need to be on a special diet while taking this medicine. Check with your doctor. Also, ask how many glasses of fluid you need to drink a day. You must not get dehydrated. You may get drowsy or dizzy. Do not drive, use machinery, or do anything that needs mental alertness until you know how this drug affects you. Do not stand or sit up quickly, especially if you are an older patient. This reduces the risk of dizzy or fainting spells. Alcohol can make you more drowsy and dizzy. Avoid alcoholic drinks. This medicine can make you more sensitive to the sun. Keep out of the sun. If you cannot avoid being in the sun, wear protective clothing and use sunscreen. Do not use sun lamps or tanning beds/booths. What side effects may I notice from receiving this medicine? Side effects that you should report to your doctor or health care professional as soon as possible: -blood in urine or stools -dry mouth -fever or chills -hearing loss or ringing in the ears -irregular heartbeat -muscle pain or weakness, cramps -skin rash -stomach upset, pain, or nausea -tingling or numbness in the hands or feet -unusually weak or tired -vomiting or diarrhea -yellowing of the eyes or skin Side effects that usually do not require medical attention (report to your doctor or health care professional if they continue or are bothersome): -headache -loss of appetite -unusual bleeding or bruising This list may not describe all possible side effects. Call your doctor for medical advice about side effects. You may report side effects to FDA at 1-800-FDA-1088. Where should I keep my medicine? Keep out of the reach of children. Store at room temperature between 15 and 30 degrees C (59 and 86 degrees F). Protect from light. Throw away any unused medicine after the expiration date. NOTE: This sheet is a summary. It may not cover all possible information. If you have questions about this medicine, talk  to your doctor, pharmacist, or health care provider.  2019 [employer] Standard (2014-05-13 13:49:50)

## 2018-03-19 DIAGNOSIS — R0602 Shortness of breath: Secondary | ICD-10-CM | POA: Diagnosis not present

## 2018-03-19 DIAGNOSIS — I25119 Atherosclerotic heart disease of native coronary artery with unspecified angina pectoris: Secondary | ICD-10-CM | POA: Diagnosis not present

## 2018-03-19 DIAGNOSIS — I119 Hypertensive heart disease without heart failure: Secondary | ICD-10-CM | POA: Diagnosis not present

## 2018-03-19 LAB — BASIC METABOLIC PANEL
BUN / CREAT RATIO: 16 (ref 12–28)
BUN: 16 mg/dL (ref 8–27)
CO2: 21 mmol/L (ref 20–29)
Calcium: 9 mg/dL (ref 8.7–10.3)
Chloride: 104 mmol/L (ref 96–106)
Creatinine, Ser: 0.99 mg/dL (ref 0.57–1.00)
GFR calc Af Amer: 63 mL/min/{1.73_m2} (ref >59)
GFR calc non Af Amer: 54 mL/min/{1.73_m2} — ABNORMAL LOW (ref >59)
GLUCOSE: 103 mg/dL — AB (ref 65–99)
Potassium: 3.6 mmol/L (ref 3.5–5.2)
SODIUM: 141 mmol/L (ref 134–144)

## 2018-03-19 LAB — PRO B NATRIURETIC PEPTIDE: NT-PRO BNP: 265 pg/mL (ref 0–738)

## 2018-04-11 NOTE — Progress Notes (Signed)
Cardiology Office Note:    Date:  04/12/2018   ID:  MARDY HOPPE, DOB Sep 23, 1940, MRN [id number]  PCP:  Townsend Roger, MD  Cardiologist:  Shirlee More, MD    Referring MD: Townsend Roger, MD    ASSESSMENT:    1. Hypertensive heart disease without heart failure   2. Coronary artery disease involving native coronary artery of native heart with angina pectoris ([place])   3. Pure hypercholesterolemia    PLAN:    In order of problems listed above:  1. Improved since starting a loop diuretic her blood pressure is been in range her weight immediately felt 10 pounds is been stable and no longer short of breath.  We decided to continue the loop diuretic sodium restriction and current antihypertensives. 2. Stable CAD continue medical therapy including aspirin or high intensity statin and nitroglycerin as needed 3. Stable continue her statin   Next appointment: As needed unfortunately she is relocating to [facility]   Medication Adjustments/Labs and Tests Ordered: Current medicines are reviewed at length with the patient today.  Concerns regarding medicines are outlined above.  No orders of the defined types were placed in this encounter.  No orders of the defined types were placed in this encounter.   Chief Complaint  Patient presents with  . Congestive Heart Failure    History of Present Illness:    Diana Simmons is a 78 y.o. female with a hx of STEMI, CAD, Dyslipidemia, Peripheral Vascular Disease and stroke with CEA 03/12/18 last seen with heart failure and placed on a loop diuretic. Compliance with diet, lifestyle and medications: Yes  She has done quite well since she started her loop diuretic lost 10 pounds the first night and since then she has had no shortness of breath blood pressures been in range and has a little bit of edema at the end of the day.  She is sodium restriction is happy with the quality of her life and at this time I will continue her on a loop  diuretic and in retrospect I do think that she has hypertensive heart disease with chronic heart failure New York Heart Association class I.   Past Medical History:  Diagnosis Date  . Cerebrovascular accident (CVA) ([place]) 02/15/2016  . Chronic anticoagulation 11/22/2014  . Coronary artery disease   . Coronary artery disease involving native coronary artery of native heart with angina pectoris ([place]) 11/22/2014   Overview:  Diffuse disease not amenable to PCI  . Falls, sequela 02/15/2016  . H/O carotid endarterectomy 02/15/2016  . Hypertension   . Lack of coordination 09/24/2012  . Parkinson's disease ([place]) 09/24/2012  . Stroke ([place])   . Tremor 02/15/2016    Past Surgical History:  Procedure Laterality Date  . ABDOMINAL AORTIC ANEURYSM REPAIR    . ABDOMINAL HYSTERECTOMY    . CARDIAC SURGERY    . CAROTID ENDARTERECTOMY    . CHOLECYSTECTOMY    . HERNIA REPAIR      Current Medications: Current Meds  Medication Sig  . Acetaminophen (TYLENOL ARTHRITIS PAIN PO) Take 2 tablets by mouth 2 (two) times daily.  Marland Kitchen aspirin EC 81 MG tablet Take 1 tablet (81 mg total) by mouth daily.  Marland Kitchen atorvastatin (LIPITOR) 40 MG tablet Take 40 mg by mouth daily at 6 PM.   . citalopram (CELEXA) 40 MG tablet Take 40 mg by mouth daily.  . fluticasone (FLONASE) 50 MCG/ACT nasal spray Place 1 spray into both nostrils 2 (two) times  daily.  . furosemide (LASIX) 20 MG tablet Take 1 tablet (20 mg total) by mouth daily.  Marland Kitchen lisinopril (PRINIVIL,ZESTRIL) 5 MG tablet Take 5 mg by mouth daily as needed.  . nitroGLYCERIN (NITROSTAT) 0.4 MG SL tablet Place 0.4 mg under the tongue every 5 (five) minutes as needed for chest pain.  Marland Kitchen omeprazole (PRILOSEC) 20 MG capsule Take 20 mg by mouth daily.  . potassium chloride SA (K-DUR,KLOR-CON) 20 MEQ tablet Take 20 mEq by mouth daily.  . pramipexole (MIRAPEX) 1.5 MG tablet Take 1.5 mg by mouth 2 (two) times daily.   . Vitamin D, Ergocalciferol, (DRISDOL) 50000 units CAPS capsule Take  50,000 Units by mouth every 14 (fourteen) days.  . [DISCONTINUED] lisinopril (PRINIVIL,ZESTRIL) 10 MG tablet Take 10 mg by mouth daily as needed.      Allergies:   Ivp dye [iodinated diagnostic agents]   Social History   Socioeconomic History  . Marital status: Married    Spouse name: Not on file  . Number of children: Not on file  . Years of education: Not on file  . Highest education level: Not on file  Occupational History  . Not on file  Social Needs  . Financial resource strain: Not on file  . Food insecurity:    Worry: Not on file    Inability: Not on file  . Transportation needs:    Medical: Not on file    Non-medical: Not on file  Tobacco Use  . Smoking status: Former Smoker    Last attempt to quit: 1990    Years since quitting: 30.1  . Smokeless tobacco: Never Used  Substance and Sexual Activity  . Alcohol use: No    Frequency: Never  . Drug use: No  . Sexual activity: Not Currently  Lifestyle  . Physical activity:    Days per week: Not on file    Minutes per session: Not on file  . Stress: Not on file  Relationships  . Social connections:    Talks on phone: More than three times a week    Gets together: More than three times a week    Attends religious service: More than 4 times per year    Active member of club or organization: Yes    Attends meetings of clubs or organizations: More than 4 times per year    Relationship status: Married  Other Topics Concern  . Not on file  Social History Narrative  . Not on file     Family History: The patient's family history includes Alzheimer's disease in her sister; Bladder Cancer in her brother and brother; CAD in her sister; Congestive Heart Failure in her father; Heart Problems in her sister; Heart attack in her mother. ROS:   Please see the history of present illness.    All other systems reviewed and are negative.  EKGs/Labs/Other Studies Reviewed:    The following studies were reviewed  today:    Recent Labs: 10/24/2017: ALT 16 10/25/2017: Hemoglobin 10.6; Platelets 212 03/19/2018: BUN 16; Creatinine, Ser 0.99; NT-Pro BNP 265; Potassium 3.6; Sodium 141  Recent Lipid Panel    Component Value Date/Time   CHOL 115 10/25/2017 0403   TRIG 71 10/25/2017 0403   HDL 36 (L) 10/25/2017 0403   CHOLHDL 3.2 10/25/2017 0403   VLDL 14 10/25/2017 0403   LDLCALC 65 10/25/2017 0403    Physical Exam:    VS:  BP 132/68 (BP Location: Left Arm, Patient Position: Sitting, Cuff Size: Large)   Pulse  72   Ht 5\' 2"  (1.575 m)   Wt 191 lb 6.4 oz (86.8 kg)   SpO2 96%   BMI 35.01 kg/m     Wt Readings from Last 3 Encounters:  04/12/18 191 lb 6.4 oz (86.8 kg)  03/12/18 190 lb 4 oz (86.3 kg)  10/26/17 186 lb 1.1 oz (84.4 kg)     GEN:  Well nourished, well developed in no acute distress HEENT: Normal NECK: No JVD; No carotid bruits LYMPHATICS: No lymphadenopathy CARDIAC: RRR, no murmurs, rubs, gallops RESPIRATORY:  Clear to auscultation without rales, wheezing or rhonchi  ABDOMEN: Soft, non-tender, non-distended MUSCULOSKELETAL:  No edema; No deformity  SKIN: Warm and dry NEUROLOGIC:  Alert and oriented x 3 PSYCHIATRIC:  Normal affect    Signed, Shirlee More, MD  04/12/2018 10:58 AM    [place]

## 2018-04-12 ENCOUNTER — Encounter: Payer: Self-pay | Admitting: Cardiology

## 2018-04-12 ENCOUNTER — Ambulatory Visit (INDEPENDENT_AMBULATORY_CARE_PROVIDER_SITE_OTHER): Payer: Medicare Other | Admitting: Cardiology

## 2018-04-12 VITALS — BP 132/68 | HR 72 | Ht 62.0 in | Wt 191.4 lb

## 2018-04-12 DIAGNOSIS — I25119 Atherosclerotic heart disease of native coronary artery with unspecified angina pectoris: Secondary | ICD-10-CM | POA: Diagnosis not present

## 2018-04-12 DIAGNOSIS — E7800 Pure hypercholesterolemia, unspecified: Secondary | ICD-10-CM | POA: Diagnosis not present

## 2018-04-12 DIAGNOSIS — I119 Hypertensive heart disease without heart failure: Secondary | ICD-10-CM | POA: Diagnosis not present

## 2018-04-12 NOTE — Patient Instructions (Addendum)
Medication Instructions:  Your physician recommends that you continue on your current medications as directed. Please refer to the Current Medication list given to you today.  If you need a refill on your cardiac medications before your next appointment, please call your pharmacy.   Lab work: NONE If you have labs (blood work) drawn today and your tests are completely normal, you will receive your results only by: Marland Kitchen MyChart Message (if you have MyChart) OR . A paper copy in the mail If you have any lab test that is abnormal or we need to change your treatment, we will call you to review the results.  Testing/Procedures: NONE  Follow-Up: At [facility], you and your health needs are our priority.  As part of our continuing mission to provide you with exceptional heart care, we have created designated Provider Care Teams.  These Care Teams include your primary Cardiologist (physician) and Advanced Practice Providers (APPs -  Physician Assistants and Nurse Practitioners) who all work together to provide you with the care you need, when you need it. You will need a follow up appointment as needed or if symptoms worsen or fail to improve.    Heart Failure  Weigh yourself every morning when you first wake up and record on a calender or note pad, bring this to your office visits. Using a pill tender can help with taking your medications consistently.  Limit your fluid intake to 2 liters daily  Limit your sodium intake to less than 2-3 grams daily. Ask if you need dietary teaching.  If you gain more than 3 pounds (from your dry weight ), double your dose of diuretic for the day.  If you gain more than 5 pounds (from your dry weight), double your dose of lasix and call your heart failure doctor.  Please do not smoke tobacco since it is very bad for your heart.  Please do not drink alcohol since it can worsen your heart failure.Also avoid OTC nonsteroidal drugs, such as advil, aleve and  motrin.  Try to exercise for at least 30 minutes every day because this will help your heart be more efficient. You may be eligible for supervised cardiac rehab, ask your physician.

## 2018-05-07 ENCOUNTER — Other Ambulatory Visit: Payer: Self-pay | Admitting: Cardiology

## 2018-05-28 ENCOUNTER — Other Ambulatory Visit: Payer: Self-pay

## 2018-05-28 DIAGNOSIS — I10 Essential (primary) hypertension: Secondary | ICD-10-CM | POA: Diagnosis not present

## 2018-05-28 NOTE — Patient Outreach (Signed)
[facility]) Care Management  05/28/2018  Diana Simmons [birth year] [id number]   Medication Adherence call to Mrs. Diana Simmons left a message for patient to call back patient is showing past due on Lisinopril 5 mg  under [place].   [place] Management Direct Dial [phone]  Fax [phone] [email or web address]

## 2018-06-13 ENCOUNTER — Other Ambulatory Visit: Payer: Self-pay

## 2018-06-13 NOTE — Patient Outreach (Signed)
[facility]) Care Management  06/13/2018  Diana Simmons 18-May-1940 [id number]   Medication Adherence call to Mrs. Diana Simmons spoke with patient but patient felt uncomfortable providing with personal information she did not want to engage patient is due on Lisinopril 5 mg under [place].   [place] Management Direct Dial [phone]  Fax [phone] [email or web address]

## 2019-12-23 IMAGING — MR MR MRA HEAD W/O CM
10 of 11 series · 32 of 48 positions shown · non-contrast
Comparison: 10/24/2017 CT head.  01/05/2009 MRI head.

CLINICAL DATA: 77 y/o F; right upper extremity pain and weakness
with headache, blurred vision, right leg weakness, and slurred
speech.

EXAM:
MRI HEAD WITHOUT CONTRAST
MRA HEAD WITHOUT CONTRAST
TECHNIQUE: Multiplanar, multiecho pulse sequences of the brain and surrounding
structures were obtained without intravenous contrast. Angiographic
images of the head were obtained using MRA technique without
contrast.

[Series 3: DWI · axial · 3.0mm · 1.09mm/px · z∈[-73,+63]mm · 7 of 94 slices shown (1 of 4)]
[im 1/94]
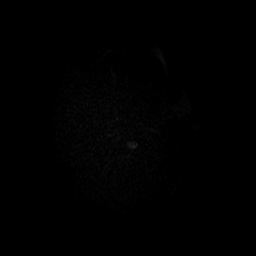
[im 16/94]
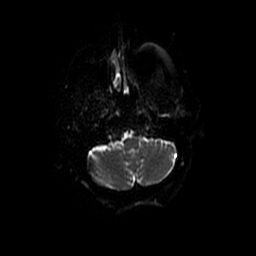
[im 32/94]
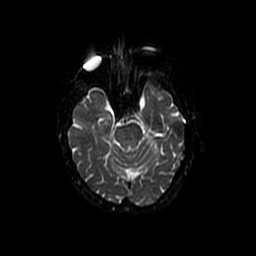
[im 47/94]
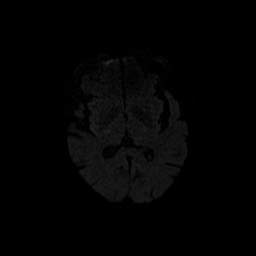
[im 63/94]
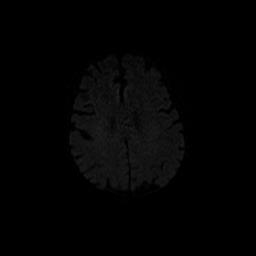
[im 78/94]
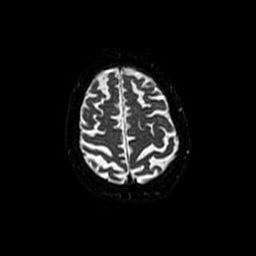
[im 94/94]
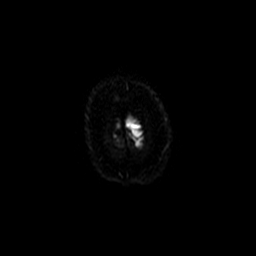

[Series 4: T2 · axial · 5.0mm · 0.43mm/px · z∈[-72,+65]mm · 2 of 24 slices shown (1 of 2)]
[im 1/24]
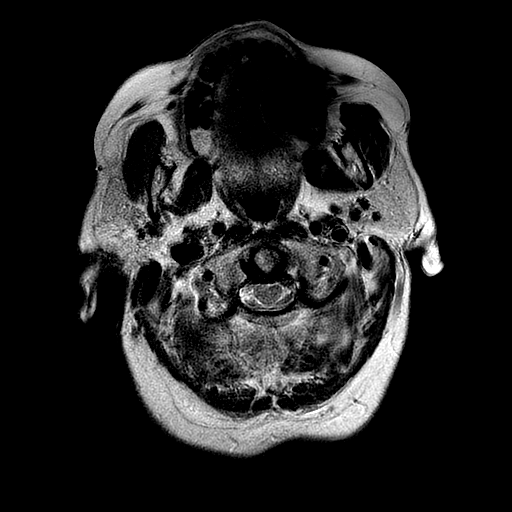
[im 24/24]
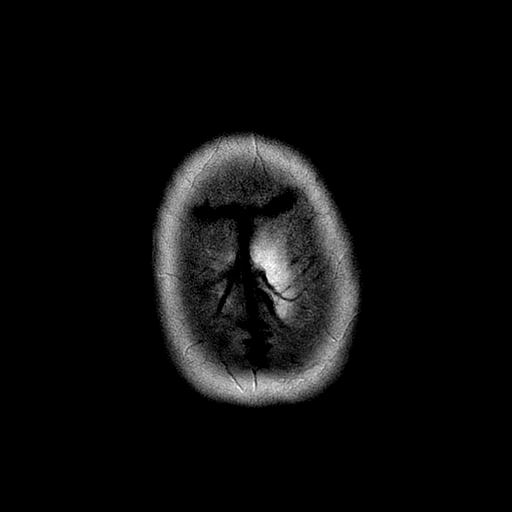

[Series 5: FLAIR · axial · 5.0mm · 0.43mm/px · z∈[-72,+65]mm · 2 of 24 slices shown]
[im 1/24]
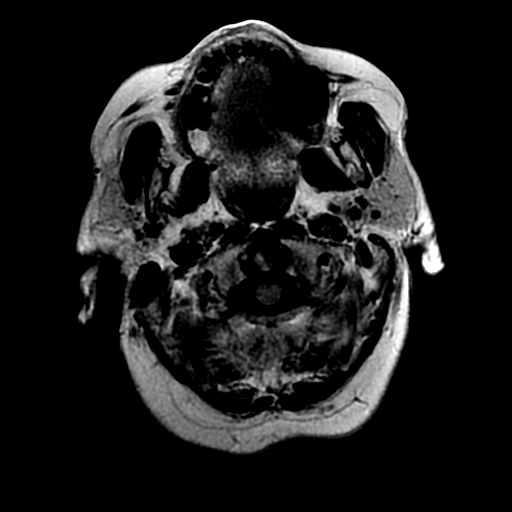
[im 24/24]
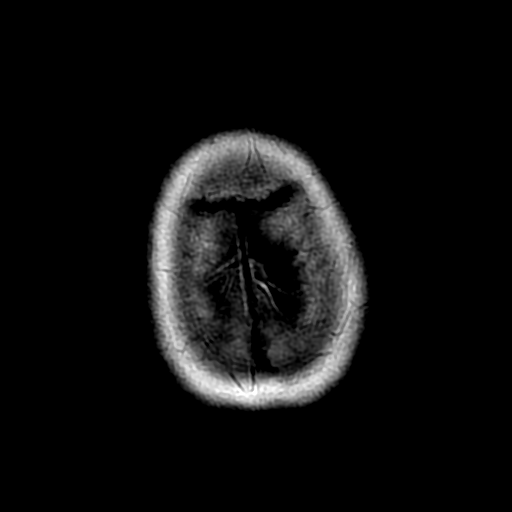

[Series 6: ax mpgr · axial · 5.0mm · 0.43mm/px · z∈[-72,+65]mm · 2 of 24 slices shown]
[im 1/24]
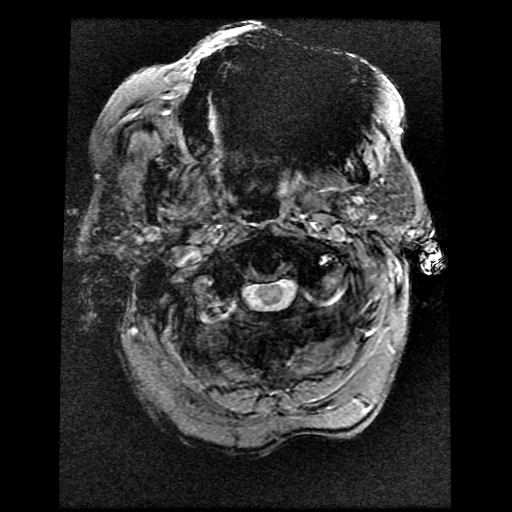
[im 24/24]
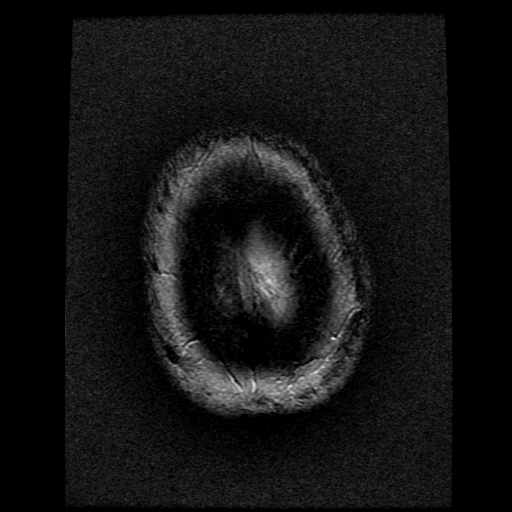

[Series 8: (id) mt fs · axial · 1.4mm · 0.43mm/px · z∈[-63,-36]mm · 3 of 136 slices shown]
[im 1/136]
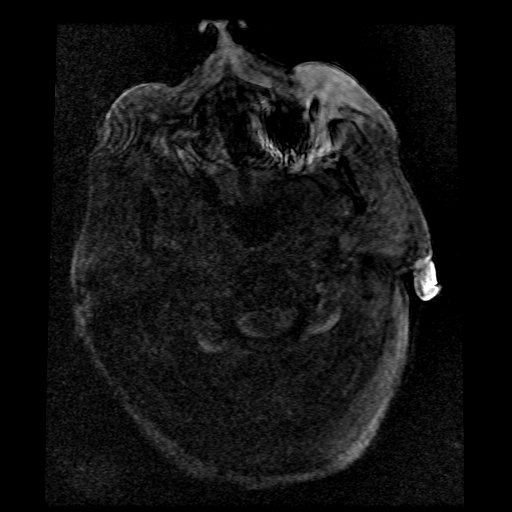
[im 28/136]
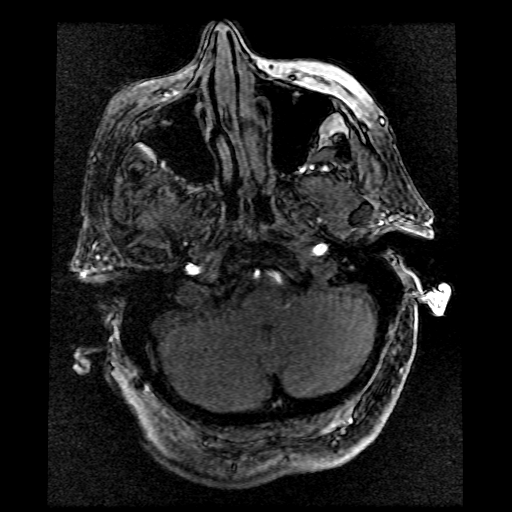
[im 41/136]
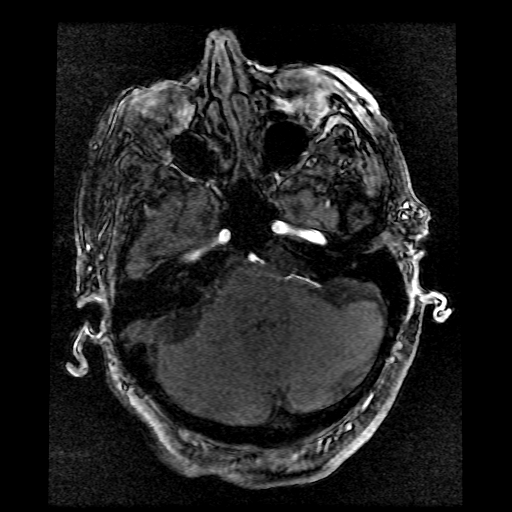

[Series 9: DWI · coronal · 5.0mm · 1.09mm/px · 5 of 66 slices shown (2 of 4)]
[im 1/66]
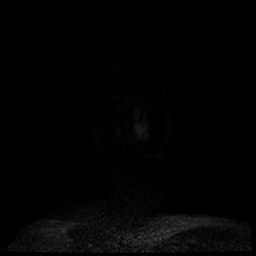
[im 17/66]
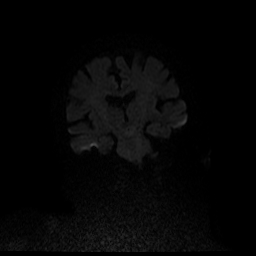
[im 33/66]
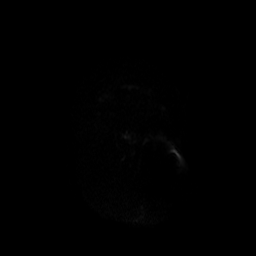
[im 49/66]
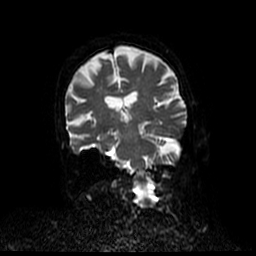
[im 66/66]
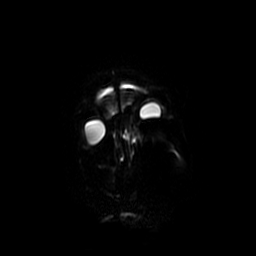

[Series 10: T2 · coronal · 5.0mm · 0.43mm/px · 2 of 28 slices shown (2 of 2)]
[im 1/28]
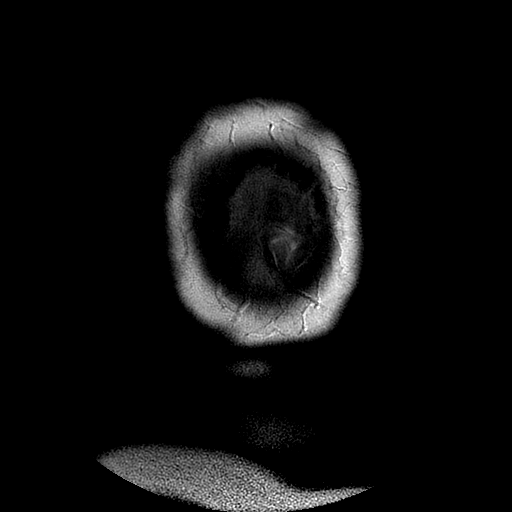
[im 28/28]
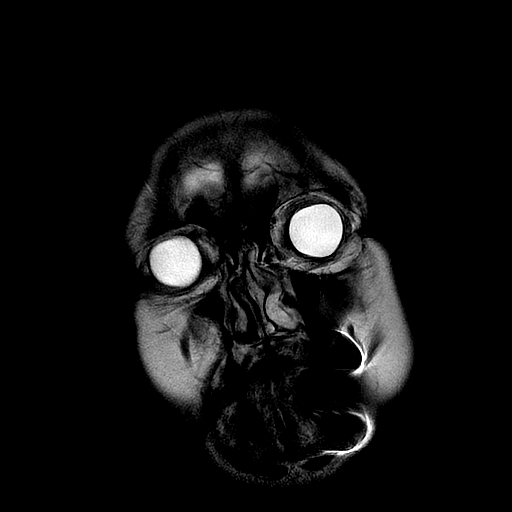

[Series 11: T1 · sagittal · 5.0mm · 0.47mm/px · 2 of 23 slices shown]
[im 1/23]
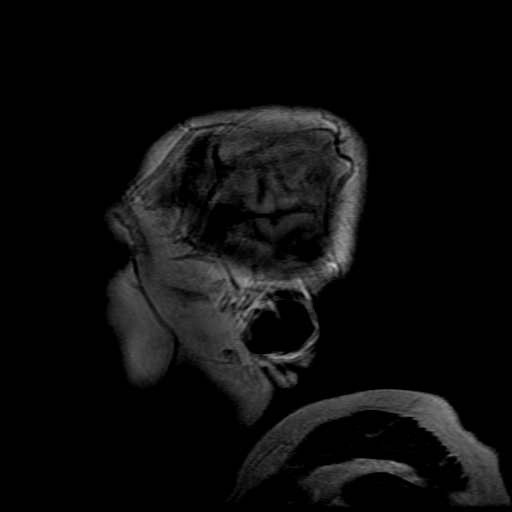
[im 23/23]
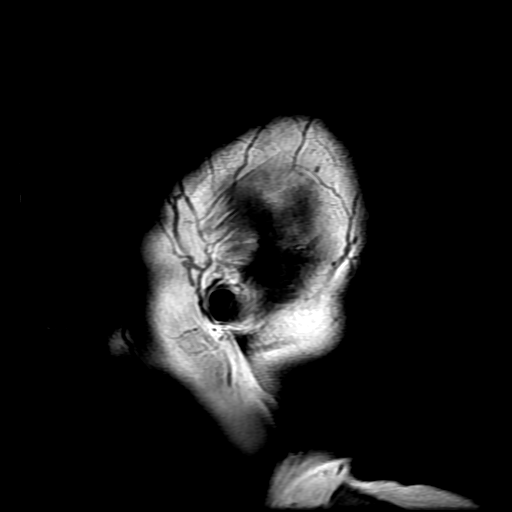

[Series 300: DWI · axial · 3.0mm · 1.09mm/px · z∈[-73,+63]mm · 4 of 47 slices shown (3 of 4)]
[im 1/47]
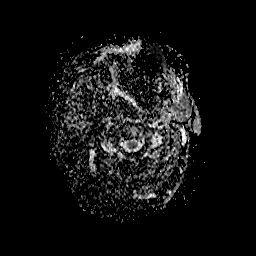
[im 16/47]
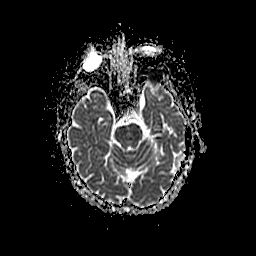
[im 31/47]
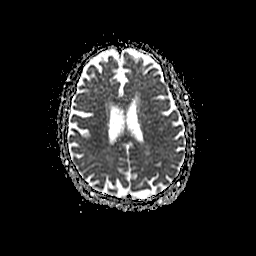
[im 47/47]
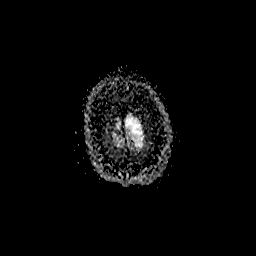

[Series 900: DWI · coronal · 5.0mm · 1.09mm/px · 3 of 32 slices shown (4 of 4)]
[im 1/32]
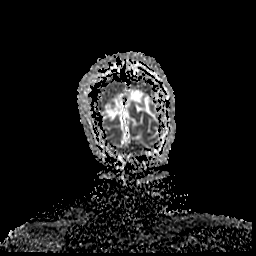
[im 16/32]
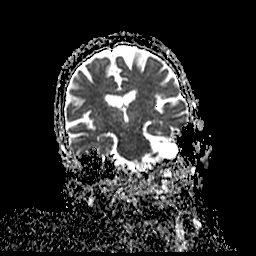
[im 32/32]
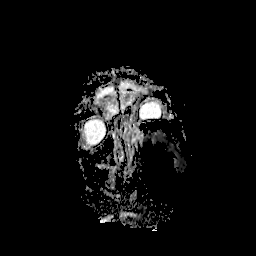

[32 of 48 positions shown; findings below may reference images not displayed]

FINDINGS: MRI HEAD FINDINGS

Brain: No acute infarction, hemorrhage, hydrocephalus, extra-axial
collection or mass lesion.

Several nonspecific T2 FLAIR hyperintensities in subcortical and
periventricular white matter are compatible with moderate chronic
microvascular ischemic changes for age. Moderate volume loss of the
brain. Mild progression from 9000 MRI. No asymmetric volume loss
within the midbrain or pons.

Vascular: As below.

Skull and upper cervical spine: Normal marrow signal.

Sinuses/Orbits: Mild paranasal sinus mucosal thickening. No abnormal
signal of mastoid air cells. Bilateral intra-ocular lens
replacement.

Other: None.

MRA HEAD FINDINGS

Internal carotid arteries:  Patent.

Anterior cerebral arteries:  Patent.

Middle cerebral arteries: Patent.

Anterior communicating artery: Patent.

Posterior communicating arteries: Patent left. No right identified,
likely hypoplastic or absent.

Posterior cerebral arteries:  Patent.

Basilar artery:  Patent.

Vertebral arteries:  Patent.

Motion degraded study. Suboptimal assessment for small 2-3 mm
aneurysm or stenosis. No large aneurysm identified.
IMPRESSION: MRI head:

1. No acute intracranial abnormality identified.
2. Moderate chronic microvascular ischemic changes and volume loss
of the brain with mild progression from 9000.

MRA head:

1. Motion degraded study. Suboptimal assessment for small aneurysm
or stenosis.
2. Patent anterior and posterior intracranial circulation. No large
vessel occlusion.

By: Casandra Urgiles M.D.
